# Patient Record
Sex: Female | Born: 1955 | Race: White | Hispanic: No | Marital: Married | Smoking: Never smoker
Health system: Southern US, Academic
[De-identification: ages and names within clinical notes are randomized; demographics above are authoritative.]

## PROBLEM LIST (undated history)

## (undated) DIAGNOSIS — I428 Other cardiomyopathies: Secondary | ICD-10-CM

## (undated) DIAGNOSIS — I10 Essential (primary) hypertension: Secondary | ICD-10-CM

## (undated) DIAGNOSIS — E785 Hyperlipidemia, unspecified: Secondary | ICD-10-CM

## (undated) DIAGNOSIS — M1990 Unspecified osteoarthritis, unspecified site: Secondary | ICD-10-CM

## (undated) DIAGNOSIS — I82409 Acute embolism and thrombosis of unspecified deep veins of unspecified lower extremity: Secondary | ICD-10-CM

## (undated) DIAGNOSIS — I447 Left bundle-branch block, unspecified: Secondary | ICD-10-CM

## (undated) DIAGNOSIS — I517 Cardiomegaly: Secondary | ICD-10-CM

## (undated) HISTORY — DX: Acute embolism and thrombosis of unspecified deep veins of unspecified lower extremity (CMS HCC): I82.409

## (undated) HISTORY — PX: HX COLECTOMY: SHX59

## (undated) HISTORY — PX: HX TUBAL LIGATION: SHX77

## (undated) HISTORY — PX: CARDIAC CATHETERIZATION: SHX172

## (undated) HISTORY — PX: HIATAL HERNIA REPAIR: SHX195

## (undated) HISTORY — DX: Other cardiomyopathies (CMS HCC): I42.8

## (undated) HISTORY — PX: HX DILATION AND CURETTAGE: SHX78

## (undated) HISTORY — DX: Hyperlipidemia, unspecified: E78.5

## (undated) HISTORY — PX: HX TONSIL AND ADENOIDECTOMY: SHX28

## (undated) HISTORY — DX: Left bundle-branch block, unspecified: I44.7

## (undated) HISTORY — PX: HX BREAST LUMPECTOMY: SHX2

---

## 1991-11-09 ENCOUNTER — Other Ambulatory Visit (HOSPITAL_COMMUNITY): Payer: Self-pay

## 2020-12-31 IMAGING — MR MRI ANKLE LT WO CONTAST
9 series · 37 of 40 positions shown · IV contrast (gadolinium)
Comparison: None available.

﻿EXAM:  [id number]   MRI ANKLE LT WO CONTAST
INDICATION: Pain, recent twisting injury.
TECHNIQUE: Multiplanar multisequential MRI of the left ankle joint was performed without gadolinium contrast.

[Series 5: shim axial · axial · left · 10.0mm · 3.12mm/px · z∈[-40,+90]mm · 3 of 14 slices shown (1 of 2)]
[im 1/14]
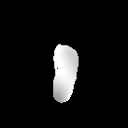
[im 7/14]
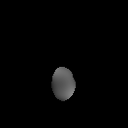
[im 14/14]
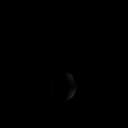

[Series 6: shim axial · axial · left · 10.0mm · 3.12mm/px · z∈[-40,+90]mm · 3 of 14 slices shown (2 of 2)]
[im 1/14]
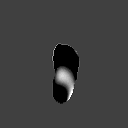
[im 7/14]
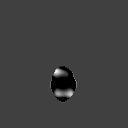
[im 14/14]
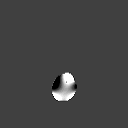

[Series 7: s-map · sagittal · left · 3.8mm · 3.75mm/px · 8 of 62 slices shown]
[im 1/62]
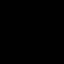
[im 7/62]
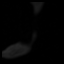
[im 21/62]
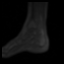
[im 28/62]
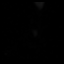
[im 34/62]
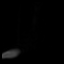
[im 41/62]
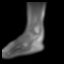
[im 55/62]
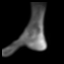
[im 62/62]
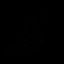

[Series 8: T1 · axial · left · 5.0mm · 0.29mm/px · z∈[-43,+105]mm · 5 of 28 slices shown (1 of 3)]
[im 1/28]
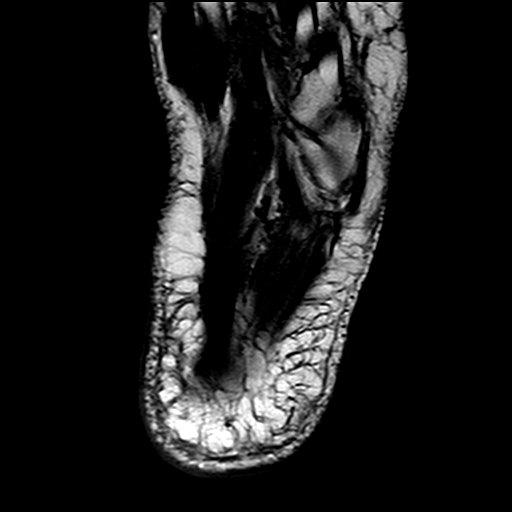
[im 7/28]
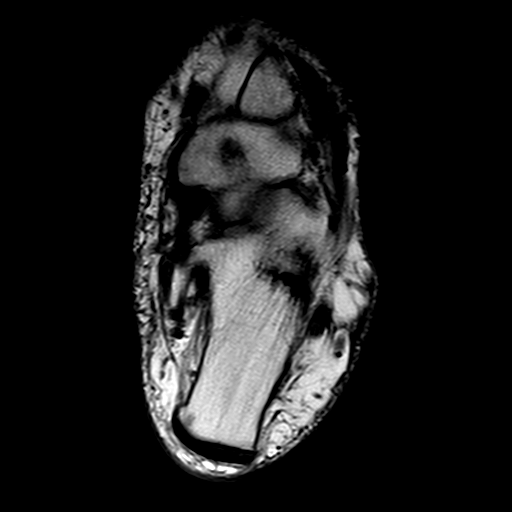
[im 14/28]
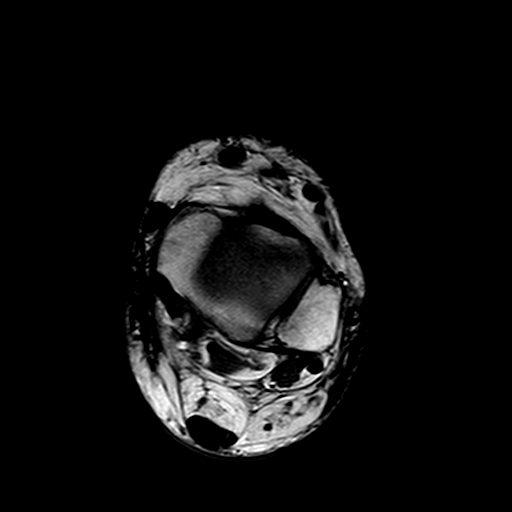
[im 21/28]
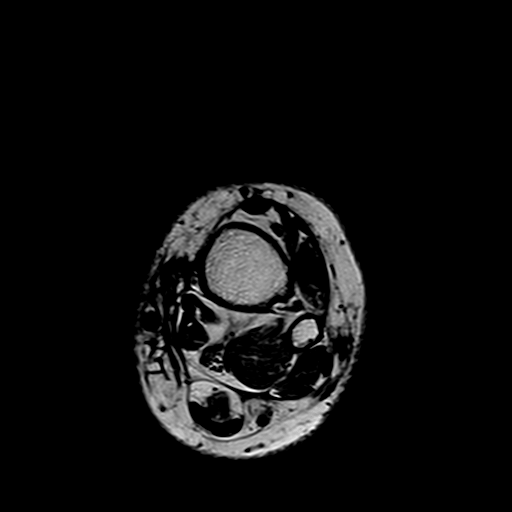
[im 28/28]
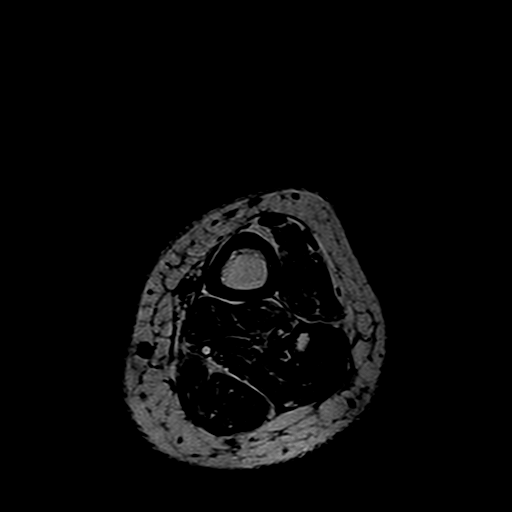

[Series 9: T2 fat-sat · axial · left · 5.0mm · 0.33mm/px · z∈[-43,+105]mm · 5 of 28 slices shown]
[im 1/28]
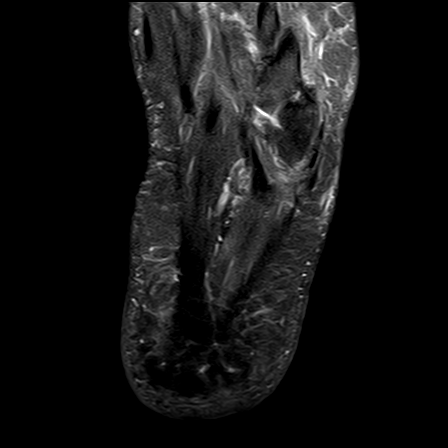
[im 7/28]
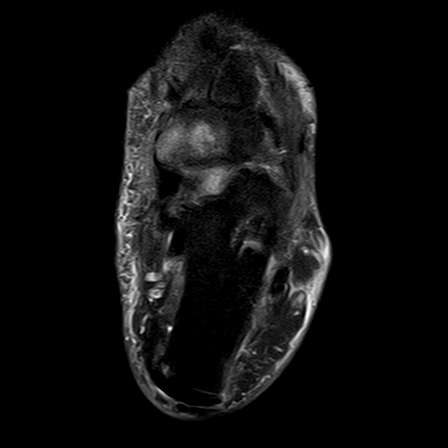
[im 14/28]
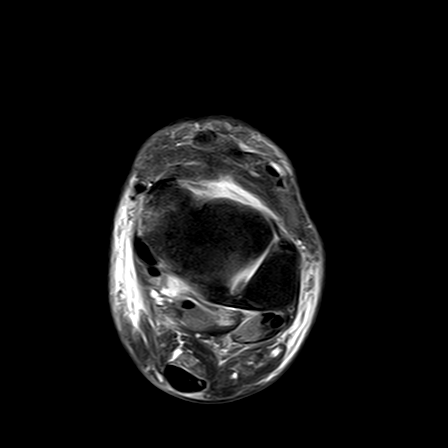
[im 21/28]
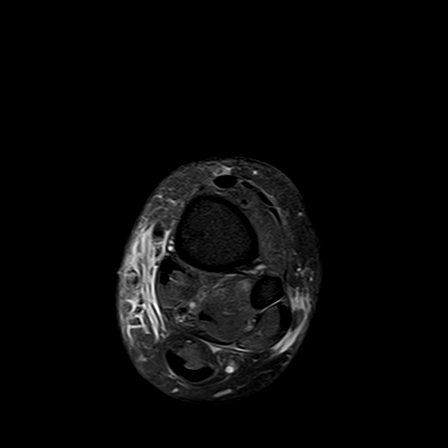
[im 28/28]
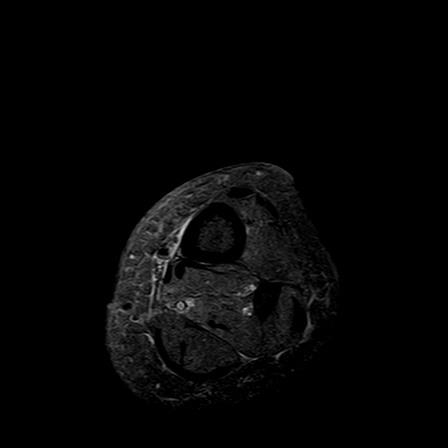

[Series 10: T1 · sagittal · left · 4.0mm · 0.33mm/px · 3 of 20 slices shown (2 of 3)]
[im 1/20]
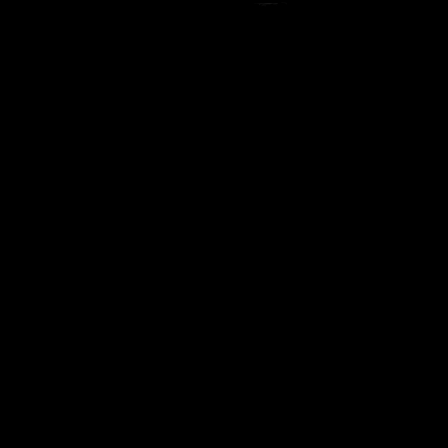
[im 10/20]
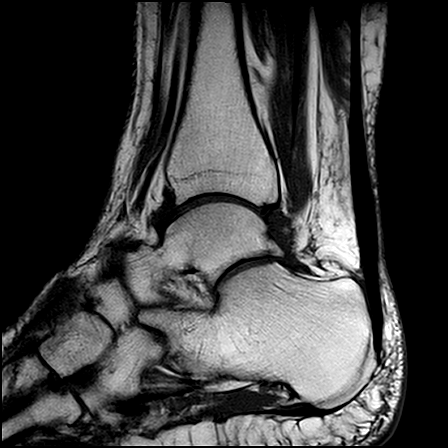
[im 20/20]
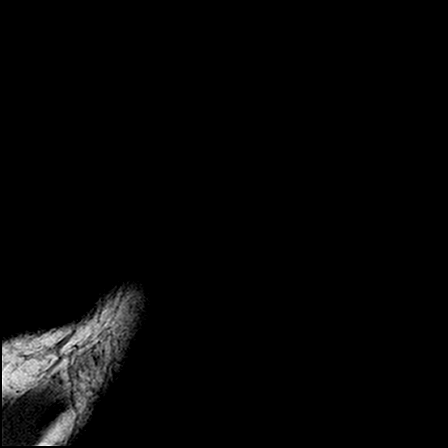

[Series 11: STIR · sagittal · left · 4.0mm · 0.47mm/px · 3 of 20 slices shown (1 of 2)]
[im 1/20]
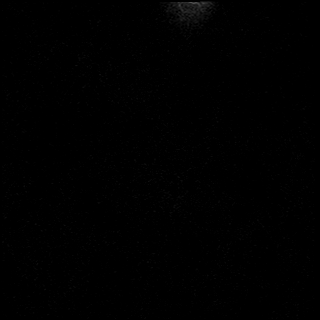
[im 10/20]
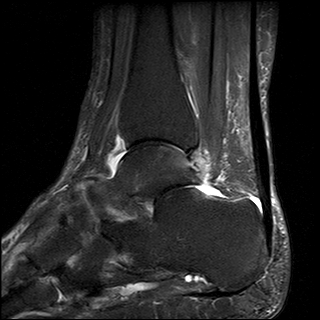
[im 20/20]
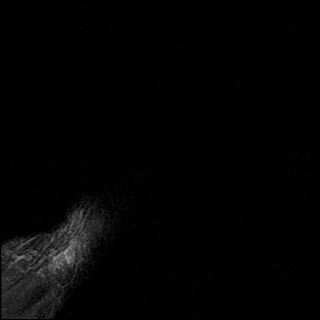

[Series 12: T1 · coronal · left · 4.0mm · 0.29mm/px · 4 of 26 slices shown (3 of 3)]
[im 1/26]
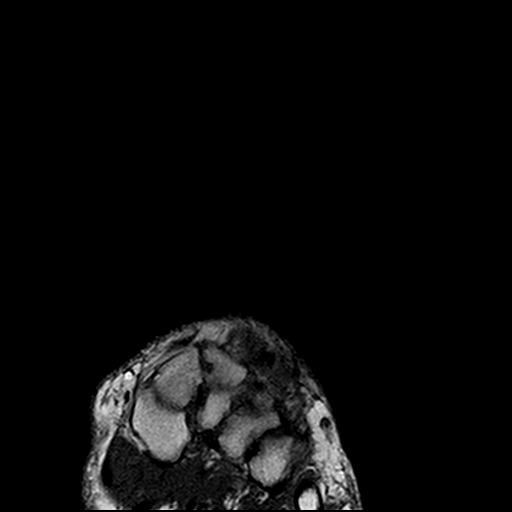
[im 9/26]
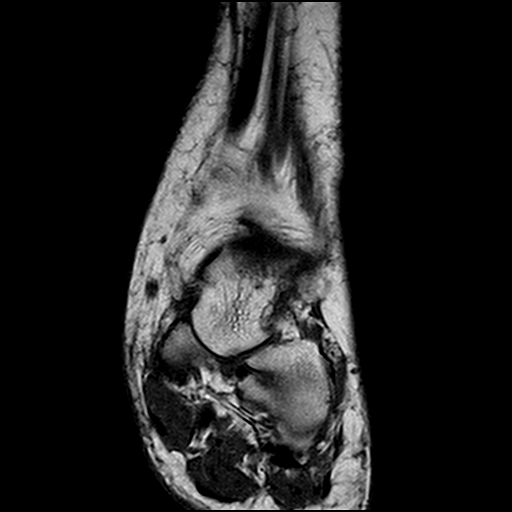
[im 17/26]
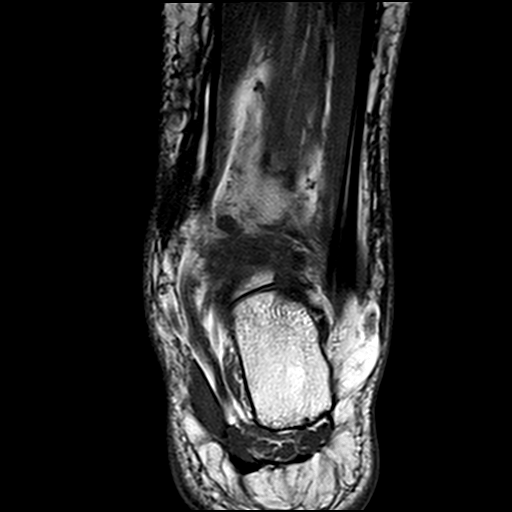
[im 26/26]
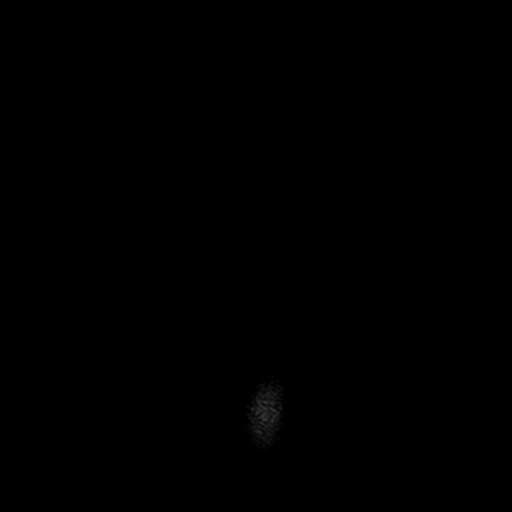

[Series 13: STIR · coronal · left · 4.0mm · 0.39mm/px · 3 of 26 slices shown (2 of 2)]
[im 1/26]
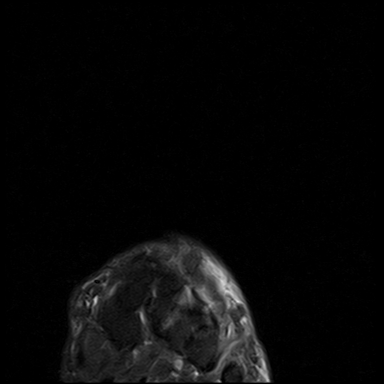
[im 9/26]
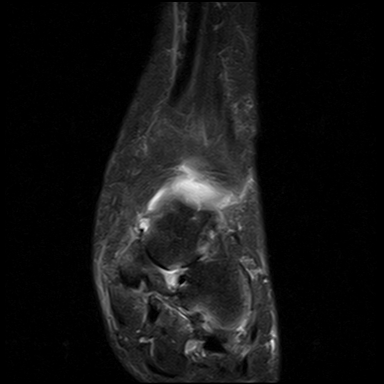
[im 17/26]
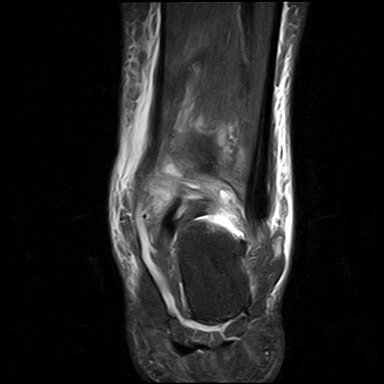

[37 of 40 positions shown; findings below may reference images not displayed]

FINDINGS: There is no acute fracture or subluxation. There is no osteochondral lesion of the talar dome. There is moderate patchy edema within navicular and cuboid bones. There is also mild subcortical edema within the medial malleolus and medial talus. Mild patchy edema is also noted within most tarsal bones as well as the anterior inferior calcaneus. Normal T1 signal intensity is seen within the sinus tarsi. Anterior talofibular ligament is completely torn. There is grade 2 sprain of the deltoid ligament. Posterior talofibular and calcaneofibular ligaments are intact. Small amount of fluid is noted within the tibialis posterior tendon sheath. Peroneal and extensor tendons are normal. Visualized plantar aponeurosis is also unremarkable without fasciitis, fibromatosis or tear. There is mild-to-moderate subcutaneous edema.
IMPRESSION: 1. Moderate patchy edema within multiple bones as detailed above. No definite evidence of an acute fracture. 

2. Completely torn anterior talofibular ligament and grade 2 sprain of the deltoid ligament. 

3. Mild tibialis posterior tenosynovitis.

## 2021-02-05 IMAGING — CR XRAY HAND MINIMUM 3 VIEWS RT
1 series · 4 of 4 positions shown · non-contrast
Comparison: None available.

﻿EXAM:  [id number]      XRAY HAND MINIMUM 3 VIEWS RT
INDICATION: Index finger pain and swelling for 2 weeks.

[Series 1: view not recorded · 0.17mm/px · 4 of 4 slices shown]
[im 1/4]
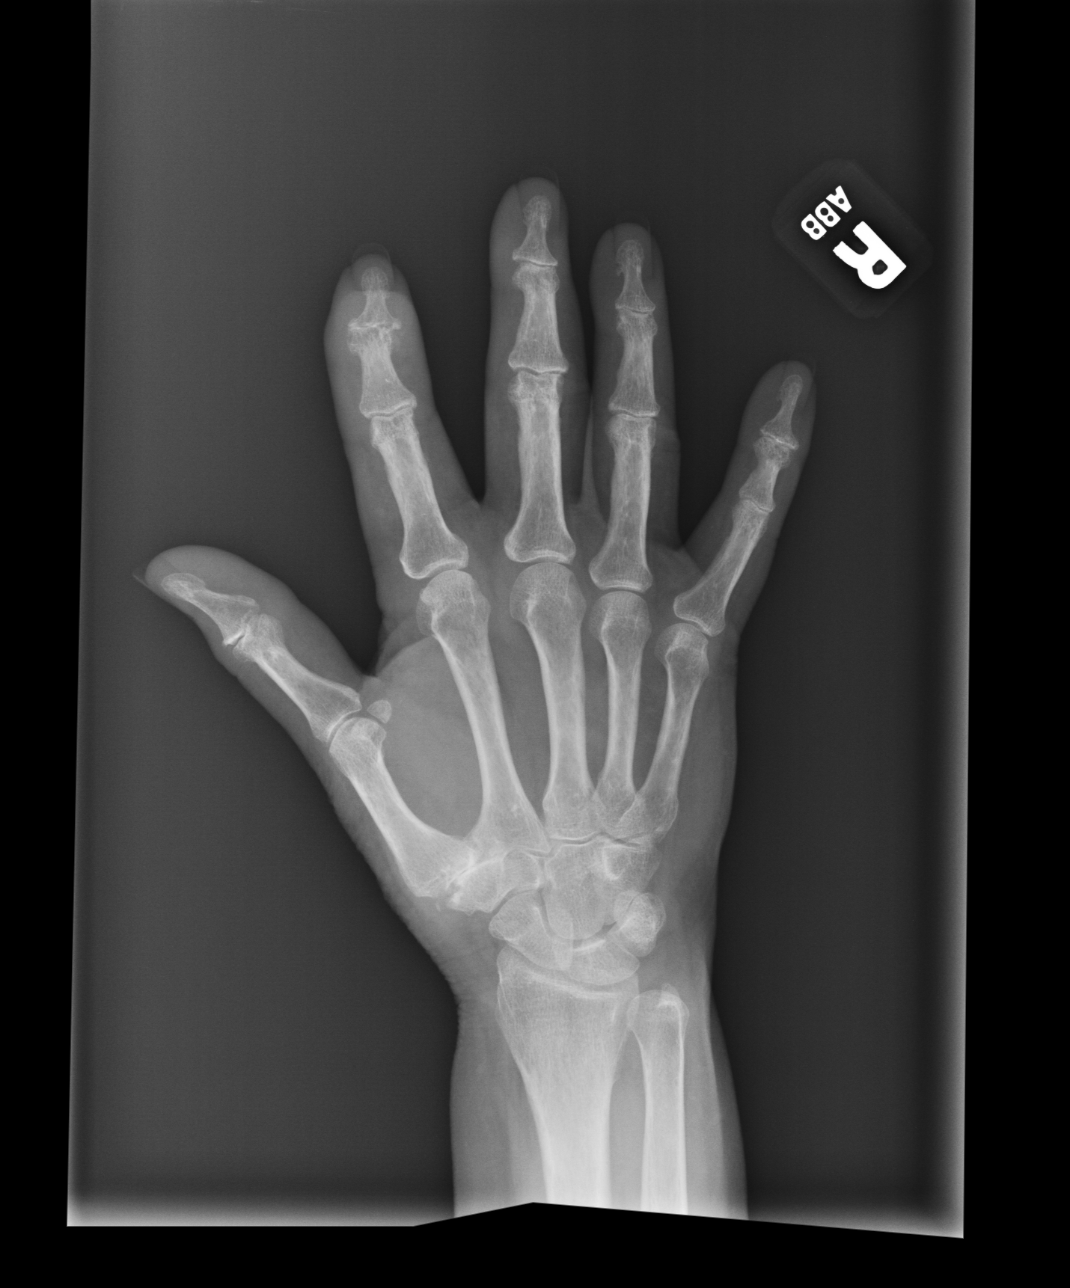
[im 2/4]
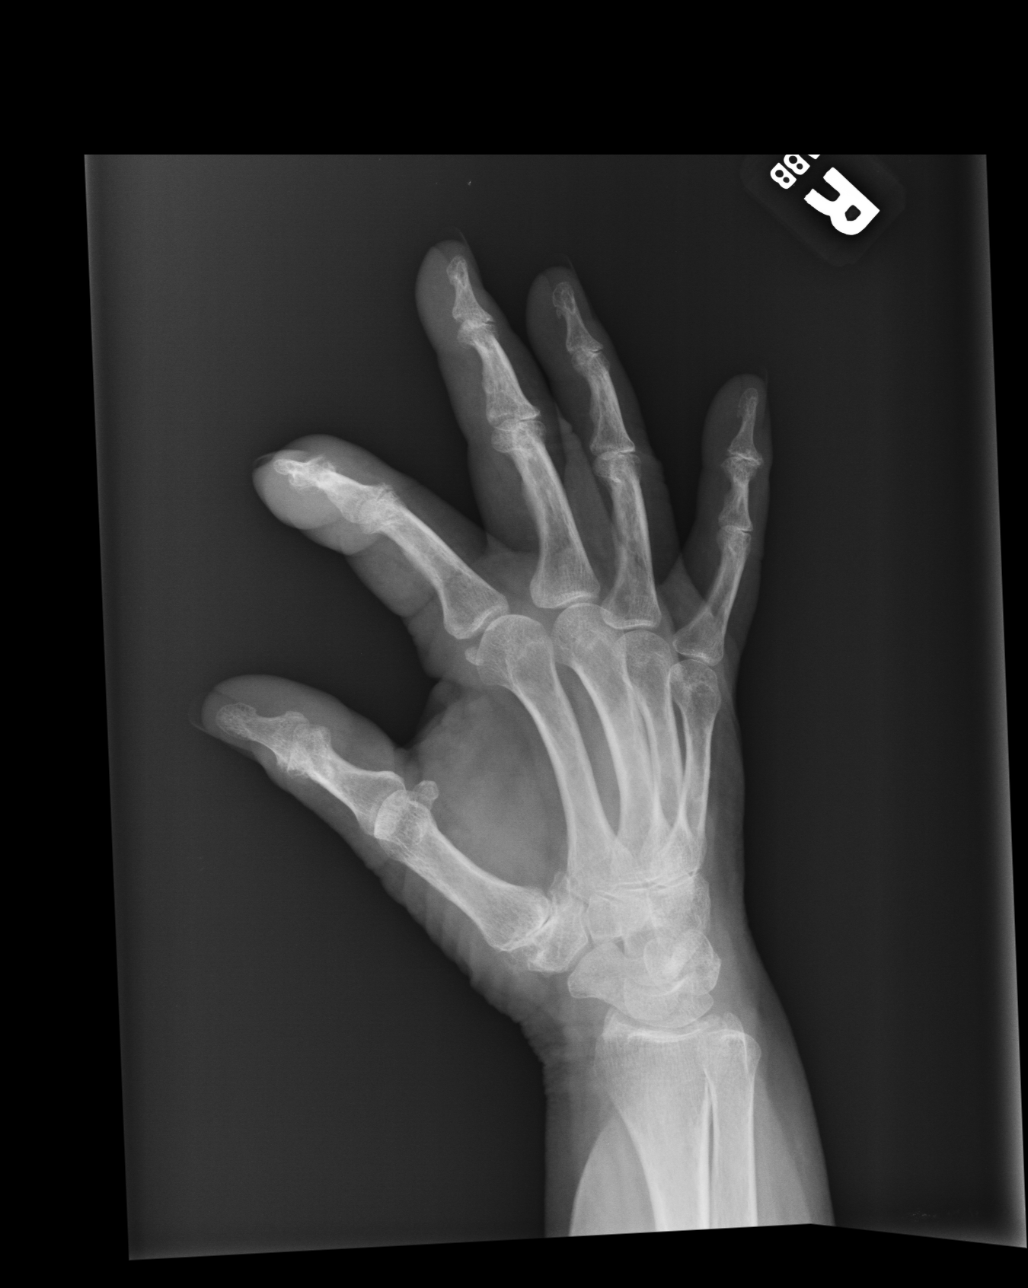
[im 3/4]
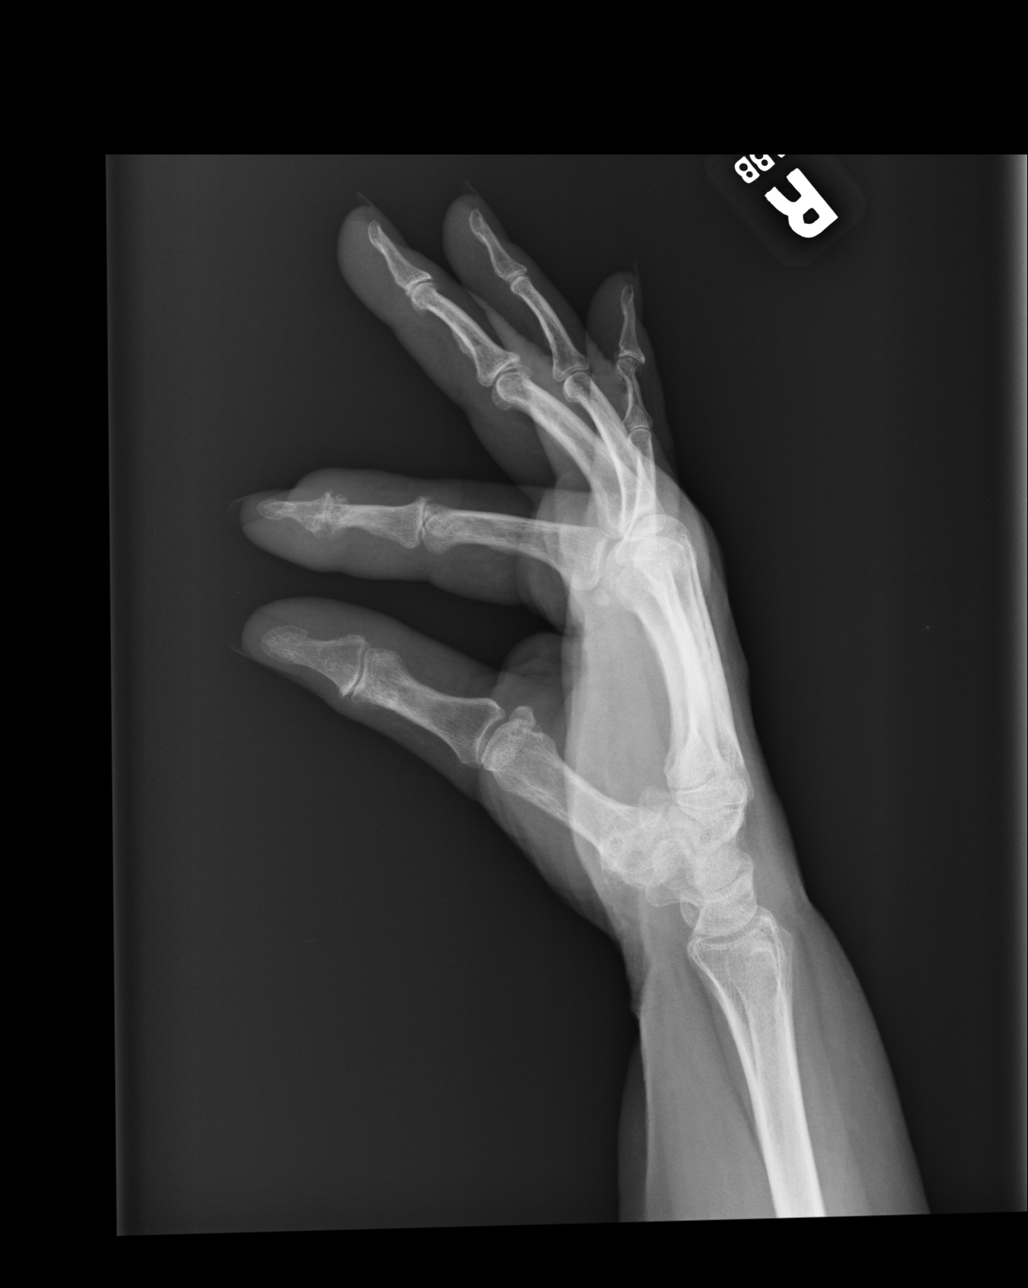
[im 4/4]
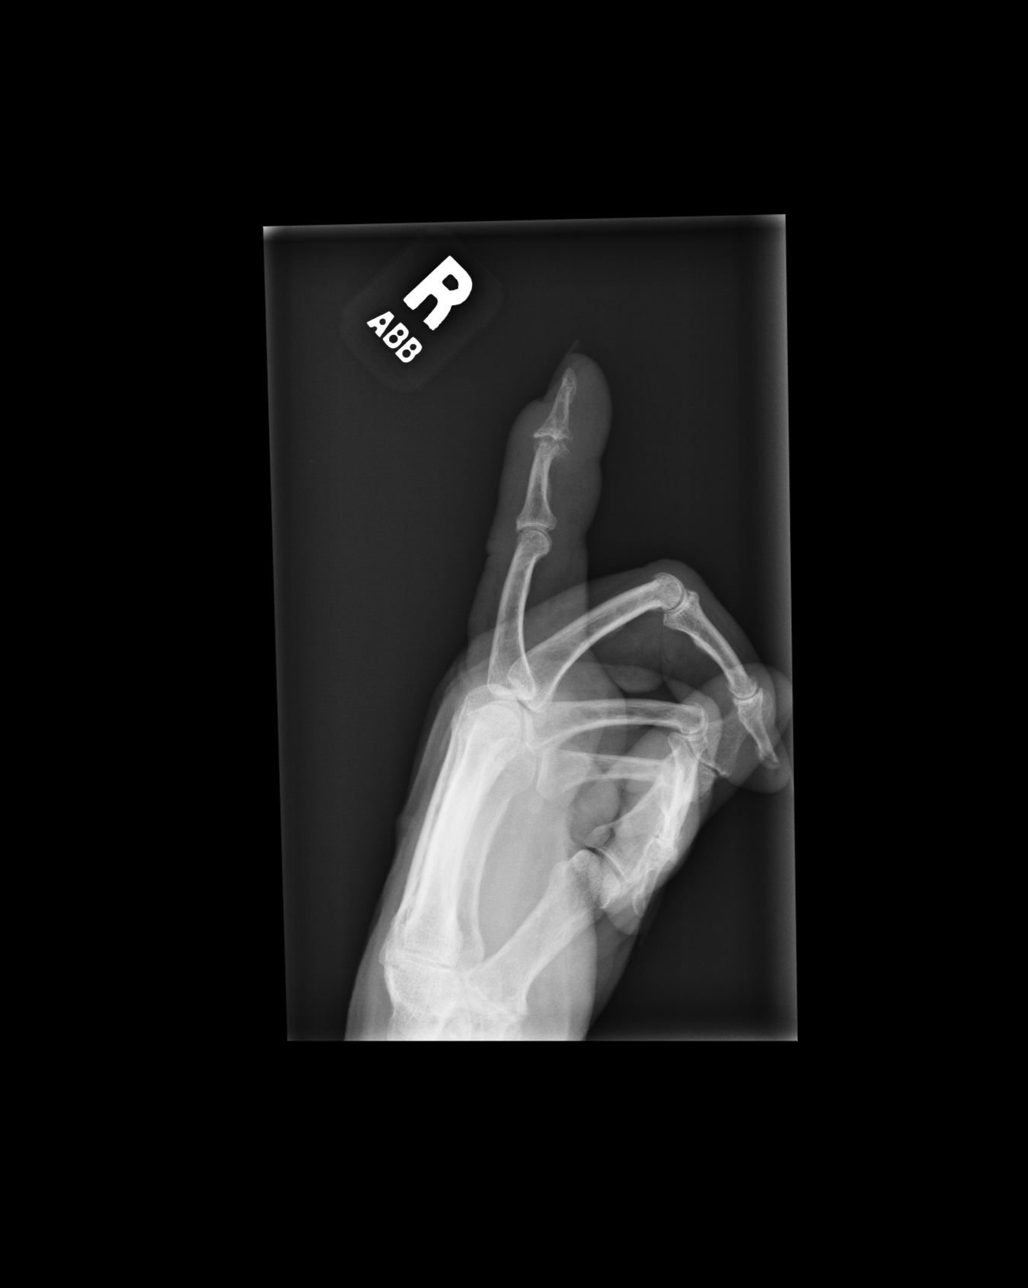

[4 of 4 positions shown; findings below may reference images not displayed]

FINDINGS: Bones are diffusely demineralized. There is no acute fracture or subluxation. There is advanced osteoarthritis of the 1st carpometacarpal and 2nd distal interphalangeal joints. Mild-to-moderate osteoarthritis of multiple other interphalangeal joints is also noted. Surrounding soft tissues are unremarkable.
IMPRESSION: Osteoarthritis, no acute osseous abnormality.

## 2021-03-03 IMAGING — MR MRI HAND RT WO CONTRAST
4 of 7 series · 19 of 40 positions shown · IV contrast (gadolinium)
Comparison: Radiographs dated 02/05/2021.

﻿EXAM:  [id number]   MRI HAND RT WO CONTRAST
INDICATION: 2nd digit pain and swelling for 2 weeks.
TECHNIQUE: Multiplanar multisequential MRI of the right hand was performed without gadolinium contrast.

[Series 6: T1 · axial · right · 4.0mm · 0.31mm/px · z∈[-94,+22]mm · 3 of 38 slices shown]
[im 5/38]
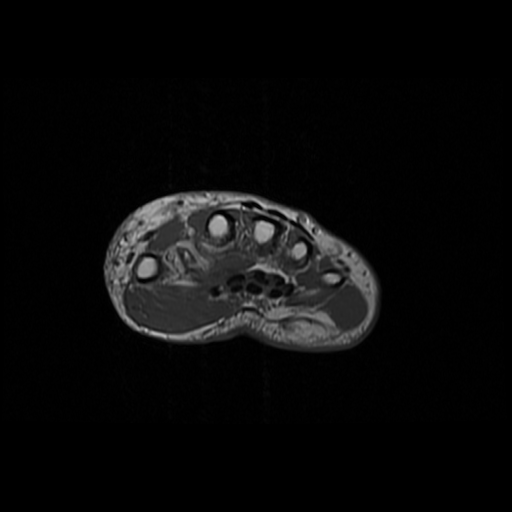
[im 19/38]
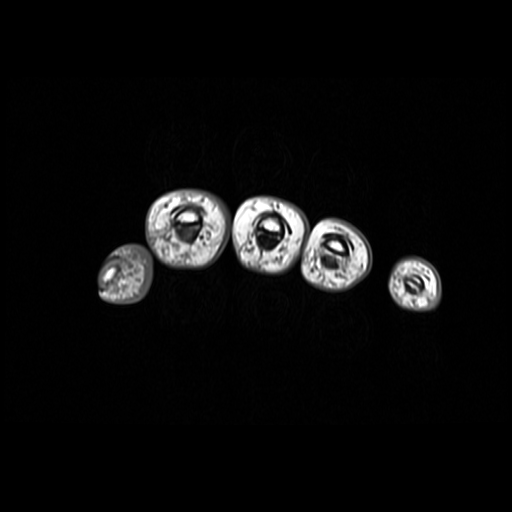
[im 33/38]
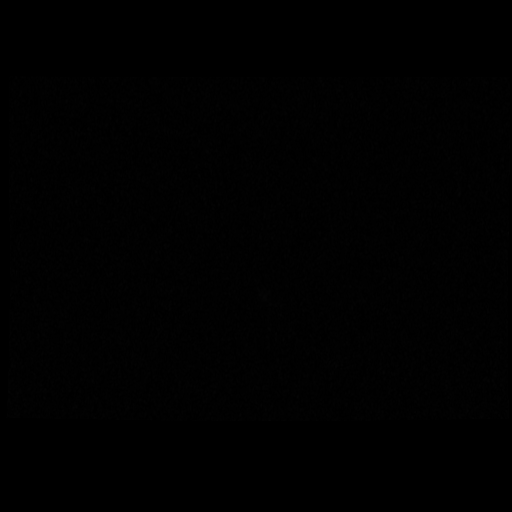

[Series 7: PD fat-sat · axial · right · 4.0mm · 0.31mm/px · z∈[-111,+42]mm · 8 of 38 slices shown (1 of 2)]
[im 1/38]
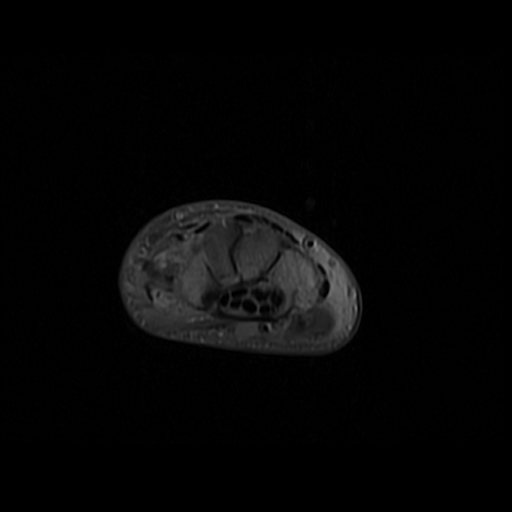
[im 6/38]
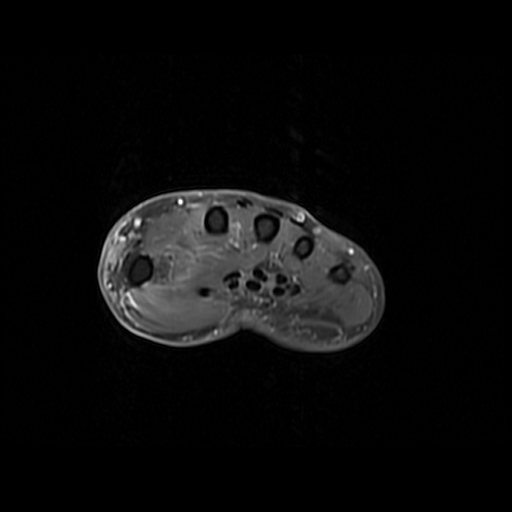
[im 11/38]
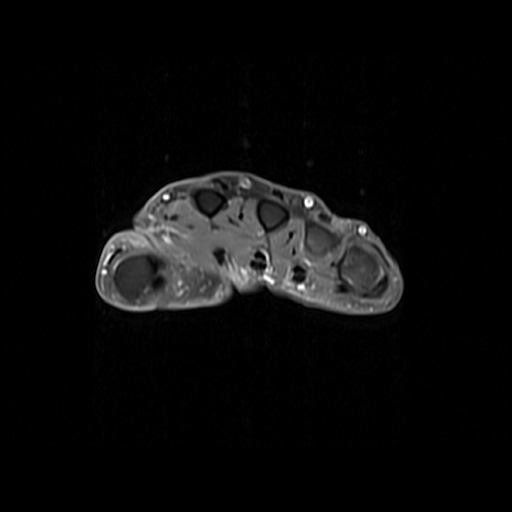
[im 16/38]
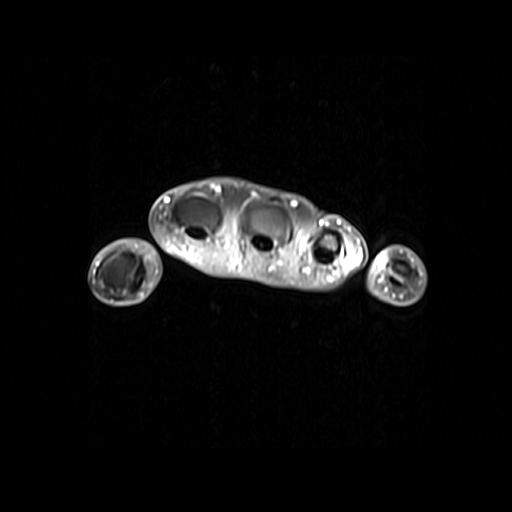
[im 22/38]
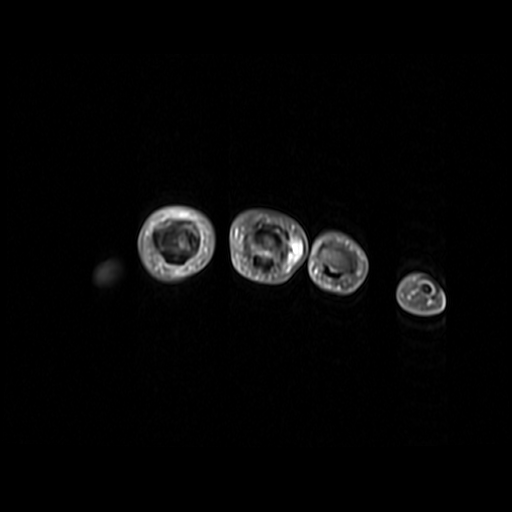
[im 27/38]
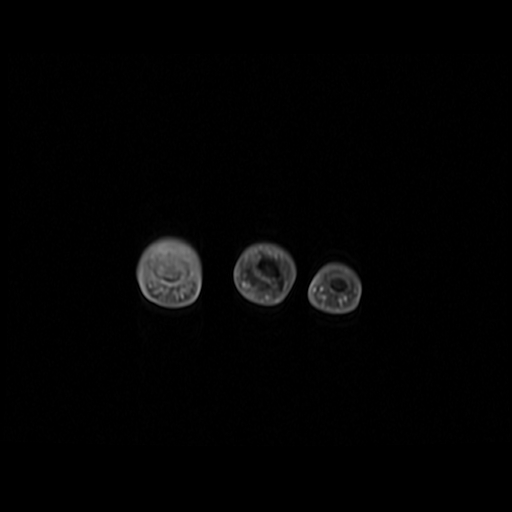
[im 32/38]
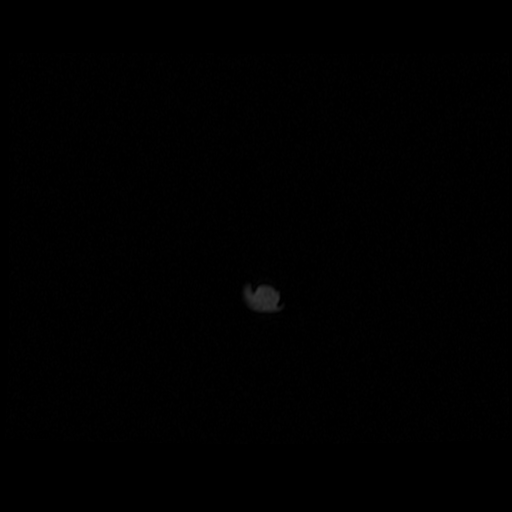
[im 38/38]
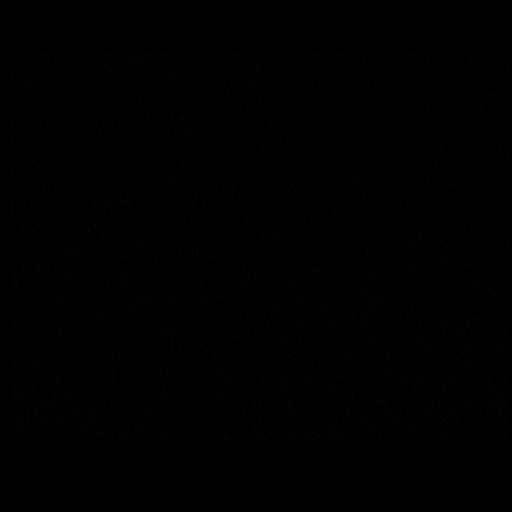

[Series 9: PD fat-sat · coronal · right · 2.5mm · 0.39mm/px · 3 of 15 slices shown (2 of 2)]
[im 1/15]
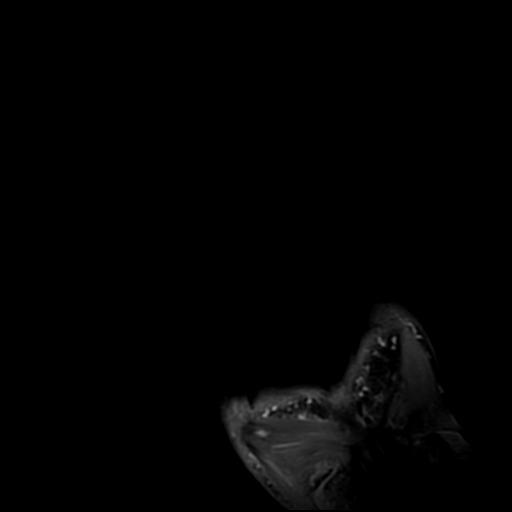
[im 8/15]
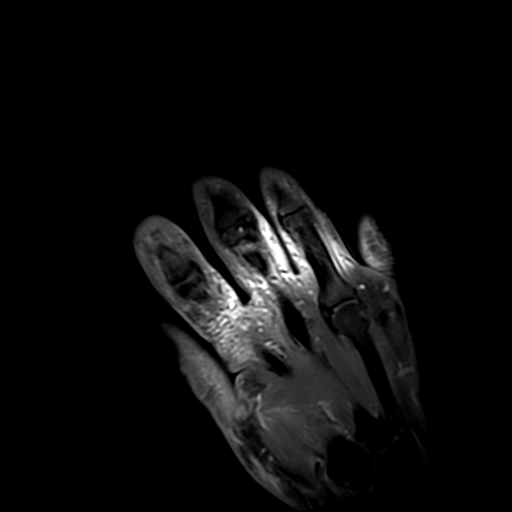
[im 15/15]
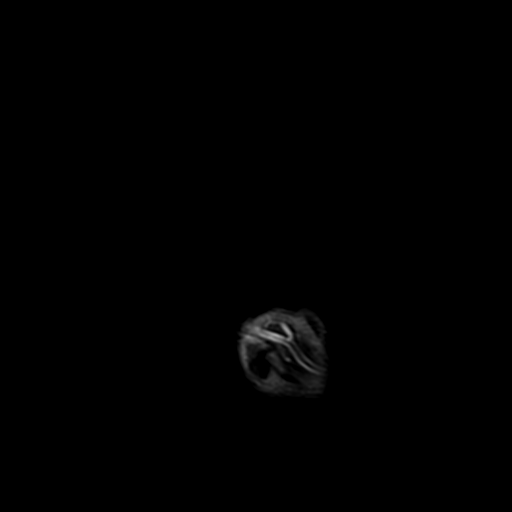

[Series 11: T2 fat-sat · sagittal · right · 3.0mm · 0.39mm/px · 5 of 30 slices shown]
[im 1/30]
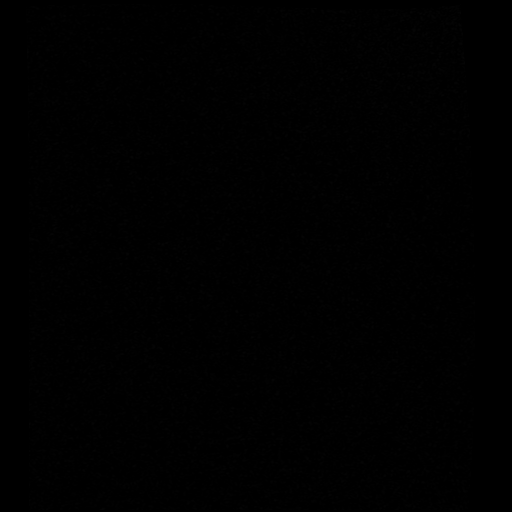
[im 5/30]
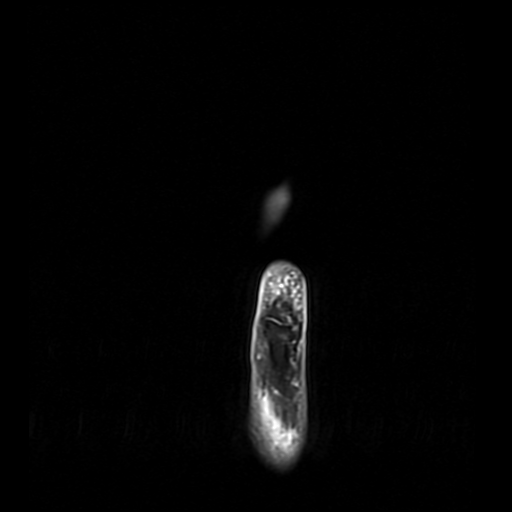
[im 10/30]
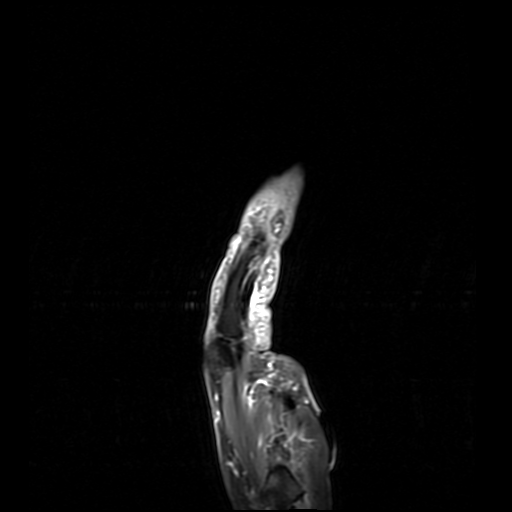
[im 15/30]
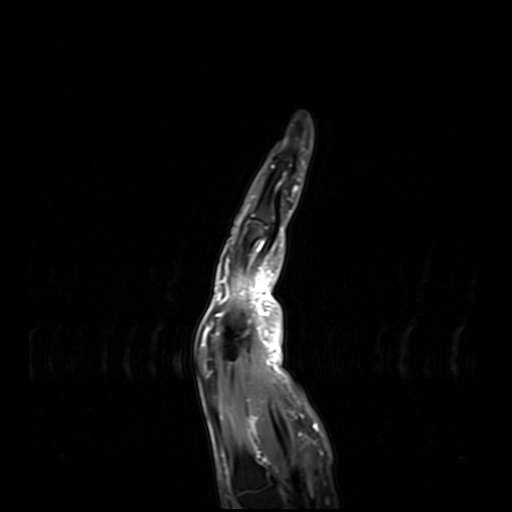
[im 25/30]
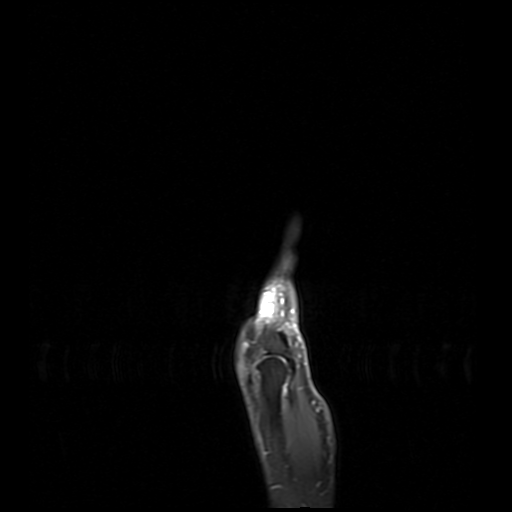

[19 of 40 positions shown; findings below may reference images not displayed]

FINDINGS: Bone marrow signal intensity is normal. There is no acute fracture or subluxation. There is advanced osteoarthritis of the 1st carpometacarpal and 2nd distal interphalangeal joints. Moderate subarticular edema is also seen within the middle and distal phalanges of the index finger along the distal interphalangeal joint. There is also mild-to-moderate overlying soft tissue swelling. Flexor and extensor tendons are normal without evidence of tenosynovitis, tendon degeneration or tear. The intrinsic muscles are normal without definite mass or evidence of denervation atrophy.
IMPRESSION: Advanced osteoarthritis of the 1st carpometacarpal and 2nd distal interphalangeal joints as detailed above.

## 2021-07-19 ENCOUNTER — Other Ambulatory Visit: Payer: Self-pay

## 2021-07-19 ENCOUNTER — Emergency Department (HOSPITAL_BASED_OUTPATIENT_CLINIC_OR_DEPARTMENT_OTHER): Payer: Medicare Other

## 2021-07-19 ENCOUNTER — Emergency Department
Admission: EM | Admit: 2021-07-19 | Discharge: 2021-07-19 | Disposition: A | Discharge status: Home / Self Care | Payer: Medicare Other | Attending: Emergency Medicine | Admitting: Emergency Medicine

## 2021-07-19 ENCOUNTER — Encounter (HOSPITAL_BASED_OUTPATIENT_CLINIC_OR_DEPARTMENT_OTHER): Payer: Self-pay

## 2021-07-19 DIAGNOSIS — S76812A Strain of other specified muscles, fascia and tendons at thigh level, left thigh, initial encounter: Secondary | ICD-10-CM | POA: Insufficient documentation

## 2021-07-19 DIAGNOSIS — W1809XA Striking against other object with subsequent fall, initial encounter: Secondary | ICD-10-CM | POA: Insufficient documentation

## 2021-07-19 DIAGNOSIS — S20212A Contusion of left front wall of thorax, initial encounter: Secondary | ICD-10-CM | POA: Insufficient documentation

## 2021-07-19 DIAGNOSIS — S46911A Strain of unspecified muscle, fascia and tendon at shoulder and upper arm level, right arm, initial encounter: Secondary | ICD-10-CM | POA: Insufficient documentation

## 2021-07-19 DIAGNOSIS — W19XXXA Unspecified fall, initial encounter: Secondary | ICD-10-CM

## 2021-07-19 DIAGNOSIS — S76312A Strain of muscle, fascia and tendon of the posterior muscle group at thigh level, left thigh, initial encounter: Secondary | ICD-10-CM

## 2021-07-19 HISTORY — DX: Cardiomegaly: I51.7

## 2021-07-19 HISTORY — DX: Essential (primary) hypertension: I10

## 2021-07-19 HISTORY — DX: Unspecified osteoarthritis, unspecified site: M19.90

## 2021-07-19 MED ORDER — CYCLOBENZAPRINE 10 MG TABLET
10.0000 mg | ORAL_TABLET | Freq: Three times a day (TID) | ORAL | 0 refills | Status: DC | PRN
Start: 2021-07-19 — End: 2022-02-04

## 2021-07-19 MED ORDER — KETOROLAC 60 MG/2 ML INTRAMUSCULAR SOLUTION
60.0000 mg | INTRAMUSCULAR | Status: AC
Start: 2021-07-19 — End: 2021-07-19
  Administered 2021-07-19: 60 mg via INTRAMUSCULAR

## 2021-07-19 MED ORDER — CYCLOBENZAPRINE 10 MG TABLET
ORAL_TABLET | ORAL | Status: AC
Start: 2021-07-19 — End: 2021-07-19
  Filled 2021-07-19: qty 1

## 2021-07-19 MED ORDER — KETOROLAC 60 MG/2 ML INTRAMUSCULAR SOLUTION
INTRAMUSCULAR | Status: AC
Start: 2021-07-19 — End: 2021-07-19
  Filled 2021-07-19: qty 2

## 2021-07-19 MED ORDER — CYCLOBENZAPRINE 10 MG TABLET
10.0000 mg | ORAL_TABLET | ORAL | Status: AC
Start: 2021-07-19 — End: 2021-07-19
  Administered 2021-07-19: 10 mg via ORAL

## 2021-07-19 MED ORDER — KETOROLAC 10 MG TABLET
10.0000 mg | ORAL_TABLET | Freq: Four times a day (QID) | ORAL | 0 refills | Status: DC | PRN
Start: 2021-07-19 — End: 2021-08-06

## 2021-07-19 NOTE — ED Triage Notes (Signed)
Left hip pain going down left leg to foot. Rates pain 3 at rest. Rates 10 when standing and walking. No deformity to left leg. Also reports left arm pain.

## 2021-07-19 NOTE — ED Provider Notes (Signed)
[facility], [facility] Emergency Department  ED Primary Provider Note  History of Present Illness   Chief Complaint   Patient presents with   . Leg Pain     Laura Leblanc is a 66 y.o. female who had concerns including Leg Pain.  Arrival: The patient arrived by ambulance complaining of standing on a 5 gal bucket trying to fill the bird feeder when it collapsed on her.  Patient did a split with her left leg forward straight and then landed on her left chest wall.  Patient is complaining of pain in her hamstring of her left leg and pain in the left ribs 5 through 8 lateral aspect of the chest.  She is also complaining of her right shoulder being unable to move her arm without severe pain.  Patient denies any neck or lower back pain.  Patient denies any loss of consciousness.  No numbness or tingling in the extremity.  No one-sided weakness.    HPI  Review of Systems   Review of Systems   Constitutional: Positive for activity change. Negative for chills and fever.   HENT: Negative for ear pain and sore throat.    Eyes: Negative for pain and visual disturbance.   Respiratory: Negative for cough and shortness of breath.    Cardiovascular: Negative for chest pain and palpitations.   Gastrointestinal: Negative for abdominal pain and vomiting.   Genitourinary: Negative for dysuria and hematuria.   Musculoskeletal: Positive for arthralgias, gait problem and myalgias. Negative for back pain.   Skin: Negative for color change and rash.   Neurological: Negative for seizures and syncope.   All other systems reviewed and are negative.     Historical Data   History Reviewed This Encounter:     Physical Exam   ED Triage Vitals [07/19/21 1712]   BP (Non-Invasive) 132/61   Heart Rate 82   Respiratory Rate 16   Temperature 37 C (98.6 F)   SpO2 96 %   Weight 89.8 kg (198 lb)   Height 1.727 m (5\' 8" )     Physical Exam  Vitals and nursing note reviewed.   Constitutional:       General: She is not in  acute distress.     Appearance: Normal appearance. She is well-developed. She is obese.   HENT:      Head: Normocephalic and atraumatic.      Right Ear: Tympanic membrane, ear canal and external ear normal.      Left Ear: Tympanic membrane, ear canal and external ear normal.      Mouth/Throat:      Mouth: Mucous membranes are moist.   Eyes:      Extraocular Movements: Extraocular movements intact.      Conjunctiva/sclera: Conjunctivae normal.      Pupils: Pupils are equal, round, and reactive to light.   Cardiovascular:      Rate and Rhythm: Normal rate and regular rhythm.      Pulses: Normal pulses.      Heart sounds: Normal heart sounds. No murmur heard.  Pulmonary:      Effort: Pulmonary effort is normal. No respiratory distress.      Breath sounds: Normal breath sounds.      Comments: Positive tenderness over removed 5 through 8 ribs on the left side lateral aspect.  No crepitus or deformity.  No swelling.  Chest:      Chest wall: Tenderness present.   Abdominal:  General: Bowel sounds are normal.      Palpations: Abdomen is soft.      Tenderness: There is no abdominal tenderness.   Musculoskeletal:         General: Tenderness present. No swelling.      Cervical back: Normal range of motion and neck supple.      Comments: Tenderness over the left hamstring that starts at the buttocks and goes to the knee.  I do not feel swelling or not.  Positive tenderness over the right anterior superior aspect of the right shoulder.  Patient is able to get her arm over her head.   Skin:     General: Skin is warm and dry.      Capillary Refill: Capillary refill takes less than 2 seconds.   Neurological:      General: No focal deficit present.      Mental Status: She is alert and oriented to person, place, and time.   Psychiatric:         Mood and Affect: Mood normal.         Behavior: Behavior normal.         Thought Content: Thought content normal.       Patient Data   Labs Ordered/Reviewed - No data to display  XR FEMUR  LEFT   Final Result by Edi, Radresults In (04/02 1813)   NO ACUTE FRACTURE OR DISLOCATION.             Radiologist location ID: [id number]         XR SHOULDER RIGHT   Final Result by Edi, Radresults In (04/02 1815)   NO ACUTE FRACTURE OR DISLOCATION.                Radiologist location ID: [id number]         XR RIBS LEFT   Final Result by Edi, Radresults In (04/02 1816)   NO EVIDENCE OF ACUTE RIB FRACTURE OR PNEUMOTHORAX.               Radiologist location ID: [id number]           Medical Decision Making        Medical Decision Making  Patient is a 66 year old white female who fell off a 5 gal bucket landing on her left side.  Patient will have x-rays performed of the left femur and left ribs and also right shoulder.    Amount and/or Complexity of Data Reviewed  Radiology: ordered.               Medications Administered in the ED   ketorolac (TORADOL) 60mg /2 mL IM injection (has no administration in time range)   cyclobenzaprine (FLEXERIL) tablet (has no administration in time range)     Clinical Impression   Strain of right shoulder, initial encounter (Primary)   Strain of left hamstring, initial encounter   Contusion of left chest wall, initial encounter       Disposition: Discharged               Clinical Impression   Strain of right shoulder, initial encounter (Primary)   Strain of left hamstring, initial encounter   Contusion of left chest wall, initial encounter       Current Discharge Medication List      START taking these medications    Details   cyclobenzaprine (FLEXERIL) 10 mg Oral Tablet Take 1 Tablet (10 mg total) by mouth Three times a day as needed for Muscle  spasms  Qty: 12 Tablet, Refills: 0      ketorolac tromethamine (TORADOL) 10 mg Oral Tablet Take 1 Tablet (10 mg total) by mouth Every 6 hours as needed for Pain  Qty: 20 Tablet, Refills: 0

## 2021-08-05 ENCOUNTER — Encounter (INDEPENDENT_AMBULATORY_CARE_PROVIDER_SITE_OTHER): Payer: Self-pay | Admitting: INTERVENTIONAL CARDIOLOGY

## 2021-08-06 ENCOUNTER — Other Ambulatory Visit: Payer: Self-pay

## 2021-08-06 ENCOUNTER — Encounter (INDEPENDENT_AMBULATORY_CARE_PROVIDER_SITE_OTHER): Payer: Self-pay | Admitting: NURSE PRACTITIONER

## 2021-08-06 ENCOUNTER — Ambulatory Visit (INDEPENDENT_AMBULATORY_CARE_PROVIDER_SITE_OTHER): Payer: Medicare Other | Admitting: NURSE PRACTITIONER

## 2021-08-06 VITALS — BP 144/82 | HR 78 | Ht 68.0 in | Wt 203.0 lb

## 2021-08-06 DIAGNOSIS — I10 Essential (primary) hypertension: Secondary | ICD-10-CM

## 2021-08-06 DIAGNOSIS — R9431 Abnormal electrocardiogram [ECG] [EKG]: Secondary | ICD-10-CM

## 2021-08-06 DIAGNOSIS — I447 Left bundle-branch block, unspecified: Secondary | ICD-10-CM

## 2021-08-06 DIAGNOSIS — E785 Hyperlipidemia, unspecified: Secondary | ICD-10-CM

## 2021-08-06 DIAGNOSIS — I428 Other cardiomyopathies: Secondary | ICD-10-CM

## 2021-08-06 MED ORDER — IBUPROFEN 800 MG TABLET
800.0000 mg | ORAL_TABLET | Freq: Three times a day (TID) | ORAL | 3 refills | Status: AC | PRN
Start: 2021-08-06 — End: ?

## 2021-08-06 NOTE — Progress Notes (Signed)
Cardiology Clinic [facility]    Name: Laura Leblanc  Age: 66 y.o.  Date of Service: 08/06/2021    Primary Care Provider: Mariah Milling, DO  Chief Complaint:   Chief Complaint   Patient presents with   . Heart Disease       Subjective:  Laura SAMPSEL is a very pleasant 66 y.o. female with a past medical history significant for non-ischemic cardiomyopathy diagnosed in 1997. The patient had a cardiac catheterization in [place] and about 11 years ago she had another cardiac catheterization on [place]. 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 Ejection fraction has been normalized on studies dated 2017 and 2018 with ejection fraction 50% to 60%. Nuclear stress test performed in July 2017 with Lexiscan stress test was normal with EF of 60 %. Echo in July 2019 showed aortic valve sclerosis and no stenosis. Anterior wall and septum were severely hypokinetic. LV function was 40% to 45%. Repeat echocardiogram in May 2020.  EF 45 to 50%. echo on May 25th, 2021 which showed mildly reduced left ventricular systolic function with an ejection fraction estimated at 45-50%.  There appeared to be hypokinesis of the basal to mid anteroseptal wall and some mild degree of dyssynergy, otherwise but this appears to be unchanged from study dated 09/12/2018, unchanged or mildly improved from study dated 09/12/2018. Mild left atrial enlargement. No significant valvular disease. Primary care plain treadmill stress test and echocardiogram in August 2022, plain treadmill stress test was inconclusive as she has underlying LBBB and only exercised for 5 minutes due to foot pain.  Echocardiogram showed reduced EF of 45% with mild basal to mid lateral hypokinesis.  Unchanged from previous.      08/06/21 The patient is here for routine follow-up for nonischemic cardiomyopathy.  She reports she is doing well.  She denies any significant chest pains or shortness of breath.  She  did have a fall recently, she was standing outside on a bucket trying to change a bird feeder and fell.  She did bruise upper left side pretty bad, but she is doing well.  She is tolerating medications without difficulty.  She states her blood pressures been well controlled at home.  She does have some arthritis issues and is wanting to know if I can center in some ibuprofen.  Otherwise, no complaints today.      Past Medical History:  Past Medical History:   Diagnosis Date   . Arthritis    . DVT (deep venous thrombosis) (CMS HCC)    . Dyslipidemia    . Enlarged heart    . Essential hypertension    . Left bundle branch block    . Non-ischemic cardiomyopathy (CMS HCC)          Social History:  Social History     Tobacco Use   Smoking Status Never   Smokeless Tobacco Never      Social History     Substance and Sexual Activity   Alcohol Use Never      Social History     Substance and Sexual Activity   Drug Use Never      Current Medications:  Current Outpatient Medications   Medication Sig   . aspirin (ECOTRIN) 81 mg Oral Tablet, Delayed Release (E.C.) Take 1 Tablet (81 mg total) by mouth Per instructions 3 times a week (Patient not taking: Reported on 08/06/2021)   . carvediloL (COREG) 6.25  mg Oral Tablet Take 2 Tablets (12.5 mg total) by mouth Twice daily with food   . cyclobenzaprine (FLEXERIL) 10 mg Oral Tablet Take 1 Tablet (10 mg total) by mouth Three times a day as needed for Muscle spasms (Patient not taking: Reported on 08/06/2021)   . docusate sodium (COLACE) 100 mg Oral Capsule Take 1 Capsule (100 mg total) by mouth Once a day   . doxycycline 100 mg Oral Tablet Take 1 Tablet (100 mg total) by mouth Twice daily (Patient not taking: Reported on 08/06/2021)   . famotidine (PEPCID) 20 mg Oral Tablet Take 1 Tablet (20 mg total) by mouth Twice daily (Patient not taking: Reported on 08/06/2021)   . furosemide (LASIX) 20 mg Oral Tablet Take 1 Tablet (20 mg total) by mouth Once a day   . Ibuprofen (MOTRIN) 800 mg Oral  Tablet Take 1 Tablet (800 mg total) by mouth Three times a day as needed for Pain   . lisinopriL (PRINIVIL) 20 mg Oral Tablet Take 1 Tablet (20 mg total) by mouth Once a day     Allergies:  Allergies   Allergen Reactions   . Morphine Anaphylaxis      Review of Systems:  Complete ROS was performed and otherwise negative unless noted in HPI.      Vital Signs:  Vitals:    08/06/21 1450 08/06/21 1454   BP: (!) 145/73 (!) 144/82   Pulse: 82 78   SpO2: 96%    Weight: 92.1 kg (203 lb)    Height: 1.727 m (5\' 8" )    BMI: 30.93       Physical Exam:  General: Pt resting comfortably in no acute distress and appears stated age.    Neck: No JVD, no carotid bruit. Neck supple, symmetrical, trachea midline.   Lungs:  Normal respiratory effort, lungs clear to auscultation bilaterally.    Cardiovascular: Regular rate and rhythm without murmurs rubs or gallops and vascular pulses are 2+ throughout.  Abdomen: Soft, non-tender and bowel sounds normal.    Extremities: Extremities normal, atraumatic, no cyanosis or edema.    Neurologic: Alert and oriented x3.     Previous Studies     Last Echocardiogram:  2022    Last Cardiac Catheterization:     Last Myocardial Perfusion Scan: 2022    Assessment:    Nonischemic cardiomyopathy (CMS HCC)    Essential hypertension    Hyperlipidemia, unspecified hyperlipidemia type    LBBB (left bundle branch block)      Plan:   Patient is stable.  Continue current medications.  Recent testing was unremarkable.  Return in 6 months for routine follow-up.  I did discuss with her about her lipids, LDL is elevated in the 120s, total cholesterol is 211.  She refuses statins.  I recommended exercise and diet.    Orders placed this visit:  Orders Placed This Encounter   . EKG (In-Clinic Today)   . Ibuprofen (MOTRIN) 800 mg Oral Tablet       [name] is to return to clinic for follow up with the understanding that should symptoms change or worsen she is to call the office or go to the closest emergency  department for evaluation.    Alene Mires, APRN,FNP-BC    A portion of this documentation may have been generated using MMODAL voice recognition software and may contain syntax/voice recognition errors.

## 2021-08-10 ENCOUNTER — Other Ambulatory Visit (INDEPENDENT_AMBULATORY_CARE_PROVIDER_SITE_OTHER): Payer: Self-pay | Admitting: NURSE PRACTITIONER

## 2021-08-10 MED ORDER — CARVEDILOL 12.5 MG TABLET
12.5000 mg | ORAL_TABLET | Freq: Two times a day (BID) | ORAL | 3 refills | Status: DC
Start: 2021-08-10 — End: 2022-02-04

## 2021-08-10 MED ORDER — LISINOPRIL 20 MG TABLET
20.0000 mg | ORAL_TABLET | Freq: Every day | ORAL | 3 refills | Status: DC
Start: 2021-08-10 — End: 2022-02-04

## 2021-08-10 MED ORDER — FUROSEMIDE 20 MG TABLET
20.0000 mg | ORAL_TABLET | Freq: Every day | ORAL | 3 refills | Status: AC
Start: 2021-08-10 — End: 2021-11-08

## 2021-08-11 LAB — ECG W INTERP (AMB USE ONLY)(MUSE,IN CLINIC)
Atrial Rate: 77 {beats}/min
Calculated P Axis: 77 degrees
Calculated R Axis: -65 degrees
Calculated T Axis: 93 degrees
PR Interval: 168 ms
QRS Duration: 146 ms
QT Interval: 416 ms
QTC Calculation: 470 ms
Ventricular rate: 77 {beats}/min

## 2021-10-22 IMAGING — MR MRI FOOT RT WO CONTRAST
4 of 6 series · 19 of 40 positions shown · IV contrast (gadolinium)
Comparison: None available.

﻿EXAM:  [id number]   MRI FOOT RT WO CONTRAST
INDICATION: Pain, recent fall.
TECHNIQUE: Multiplanar multisequential MRI of the right foot was performed without gadolinium contrast.

[Series 5: T1 · sagittal · right · 3.5mm · 0.53mm/px · 5 of 22 slices shown (1 of 3)]
[im 1/22]
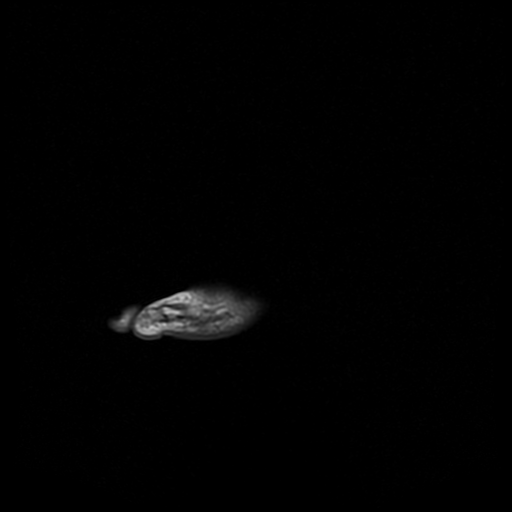
[im 6/22]
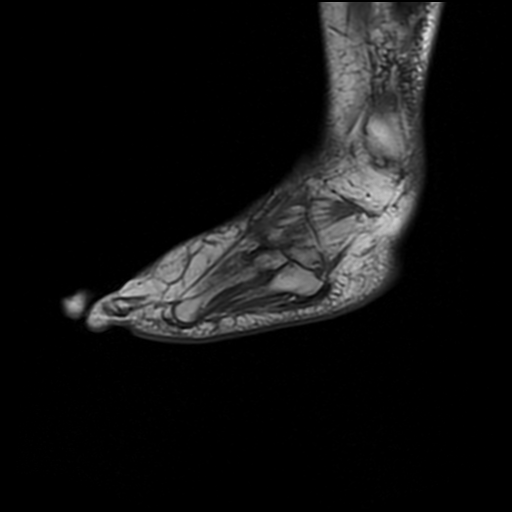
[im 11/22]
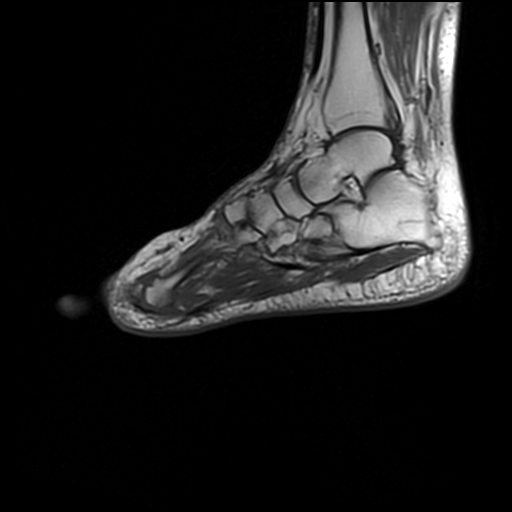
[im 16/22]
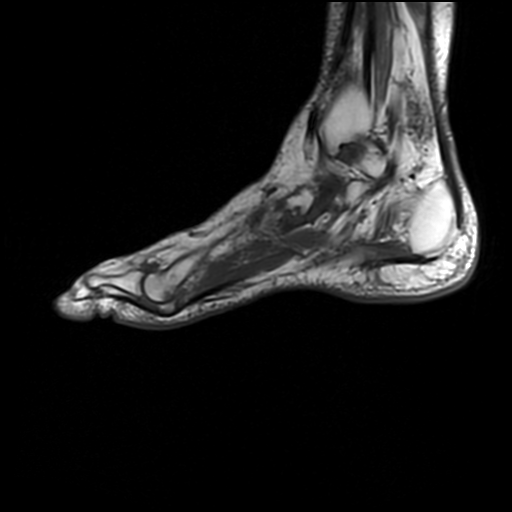
[im 22/22]
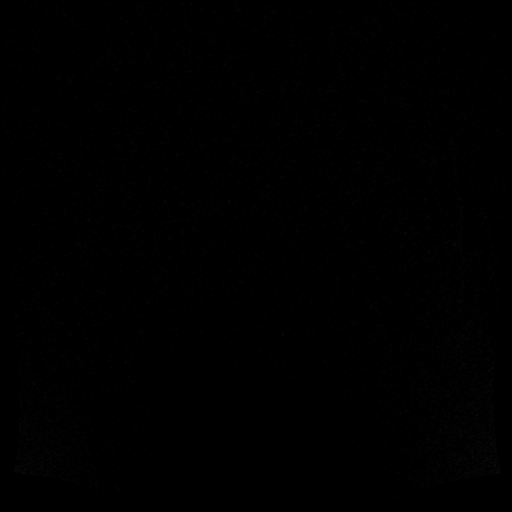

[Series 6: T1 · axial · right · 4.0mm · 0.53mm/px · z∈[-136,-32]mm · 5 of 22 slices shown (2 of 3)]
[im 1/22]
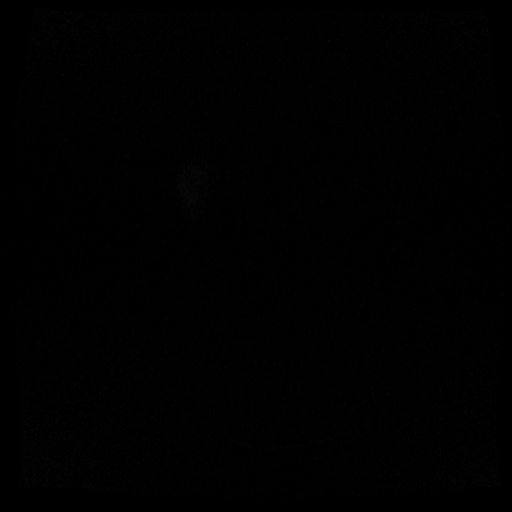
[im 5/22]
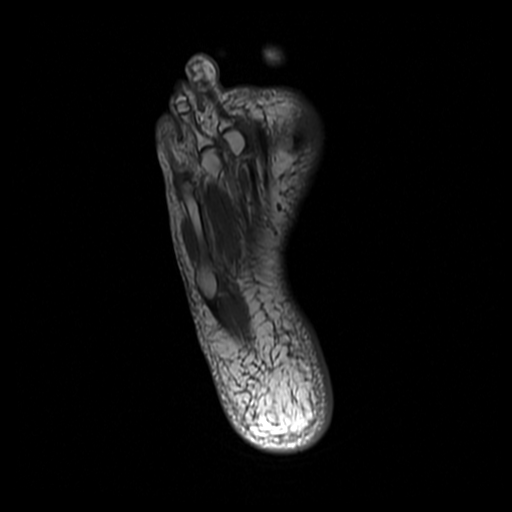
[im 9/22]
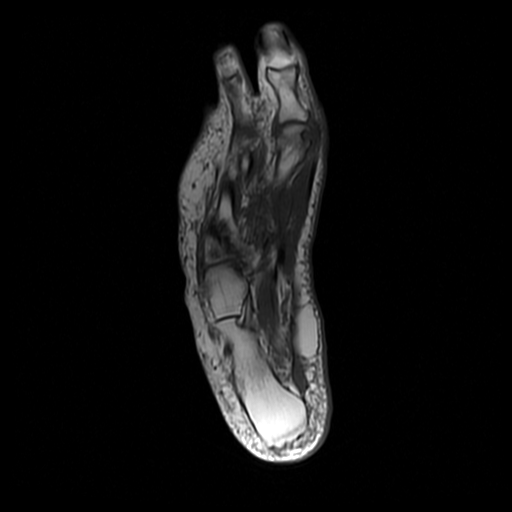
[im 13/22]
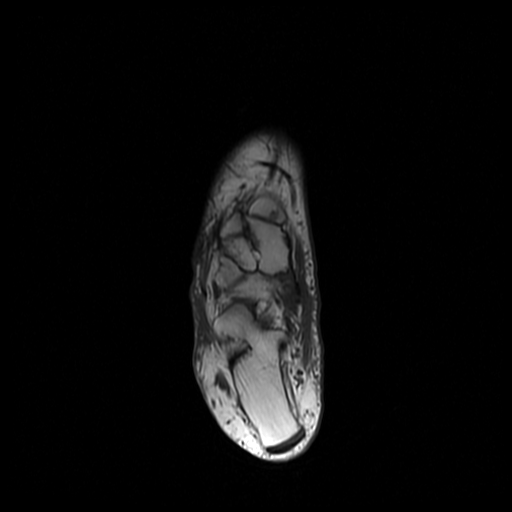
[im 22/22]
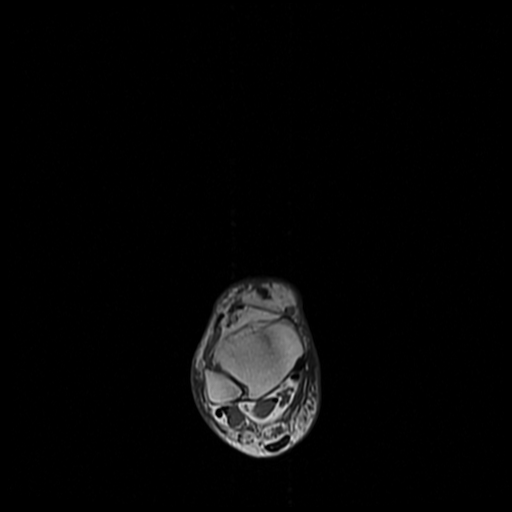

[Series 7: T2 fat-sat · axial · right · 4.0mm · 0.53mm/px · z∈[-136,-32]mm · 6 of 22 slices shown]
[im 1/22]
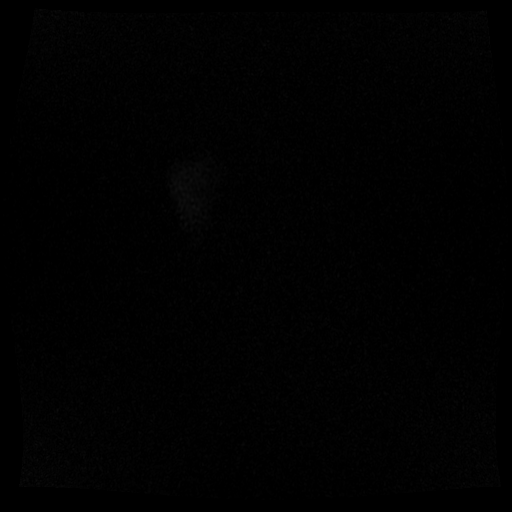
[im 5/22]
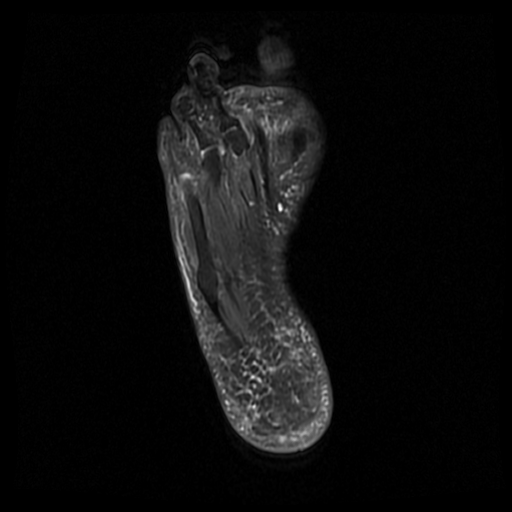
[im 9/22]
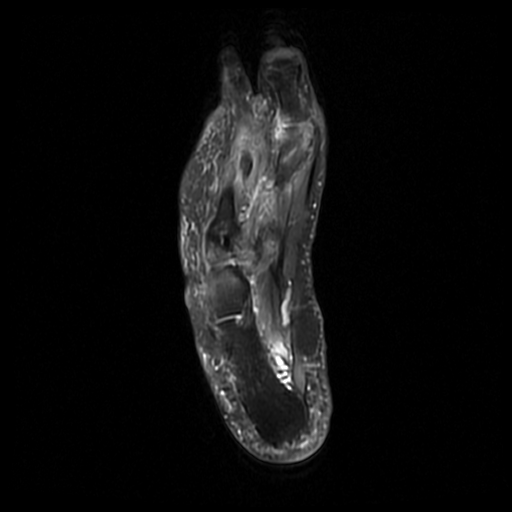
[im 13/22]
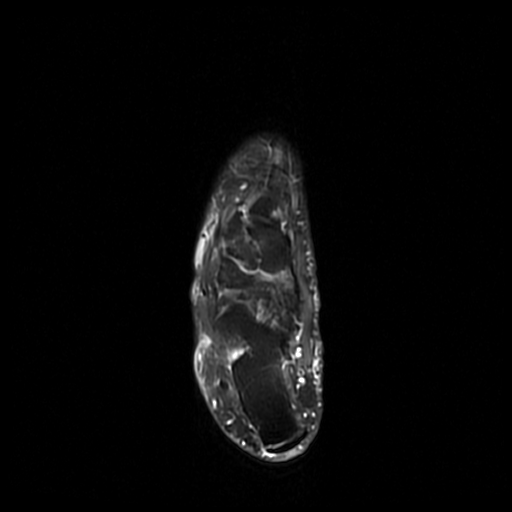
[im 17/22]
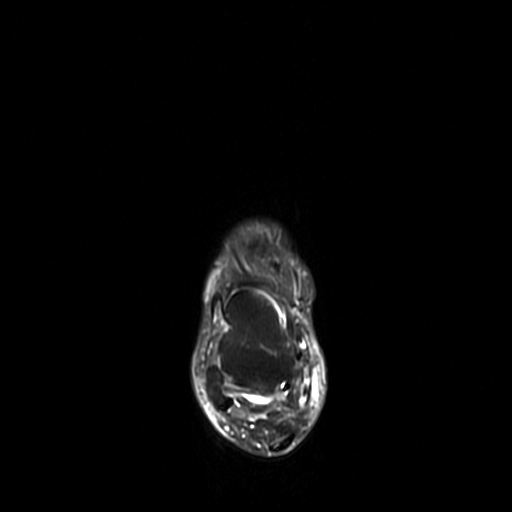
[im 22/22]
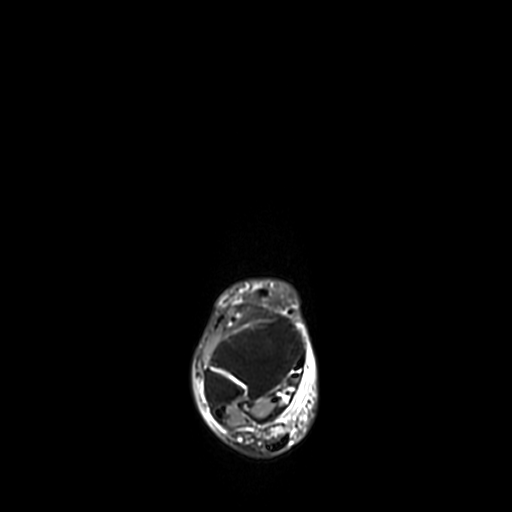

[Series 9: T1 · coronal · right · 6.0mm · 0.29mm/px · 3 of 33 slices shown (3 of 3)]
[im 5/33]
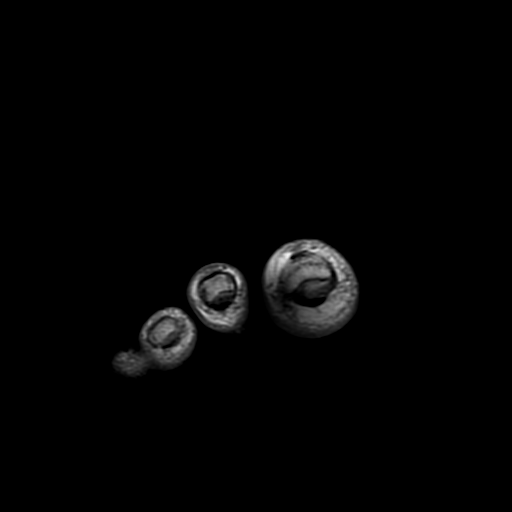
[im 17/33]
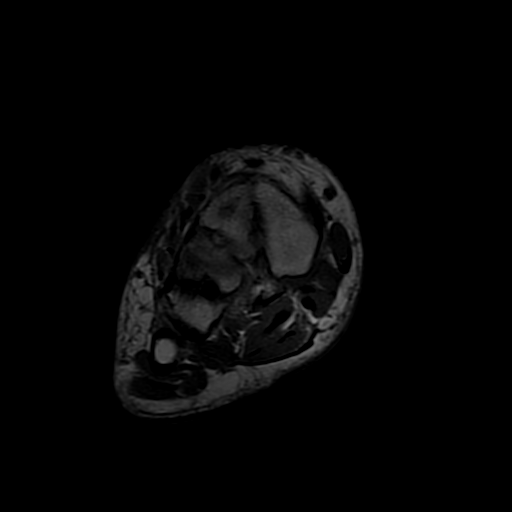
[im 29/33]
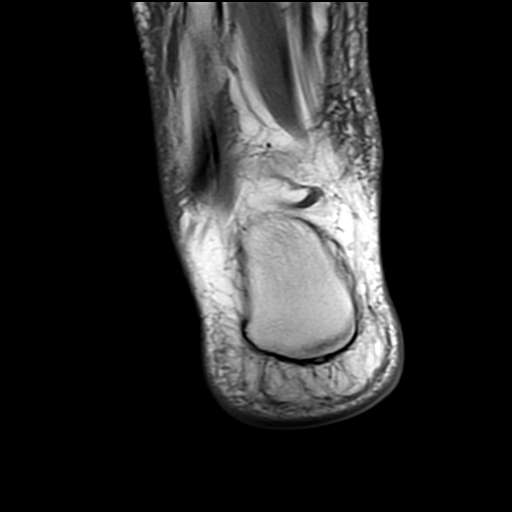

[19 of 40 positions shown; findings below may reference images not displayed]

FINDINGS: There is a nondisplaced fracture of the 2nd toe proximal phalanx. Moderate edema is also noted within the 1st and 2nd metatarsal heads.  There is mild-to-moderate midfoot osteoarthritis.  Normal T1 signal intensity is seen within the sinus tarsi.  There is no osteochondral lesion of the talar dome.  Visualized flexor, extensor and peroneal tendons are normal without evidence of tenosynovitis, tendon degeneration or tear.  Lisfranc ligament is intact.  Intrinsic muscles of the forefoot are normal without definite mass or evidence of denervation atrophy.  The plantar aponeurosis is also normal without fasciitis, fibromatosis or tear.
IMPRESSION: 1. 2nd toe proximal phalanx nondisplaced fracture. 

2. Moderate edema within the 1st and 2nd metatarsal heads.  

3. Mild-to-moderate midfoot osteoarthritis.

## 2021-12-14 IMAGING — DX XRAY HIP W/PELVIS UNIL 2-3 VIEWS
2 series · 2 of 2 positions shown · non-contrast
Comparison: None available.

﻿EXAM:  [id number]      XRAY HIP W/PELVIS UNIL 2-3 VIEWS
INDICATION: Left hip pain.

[AP]
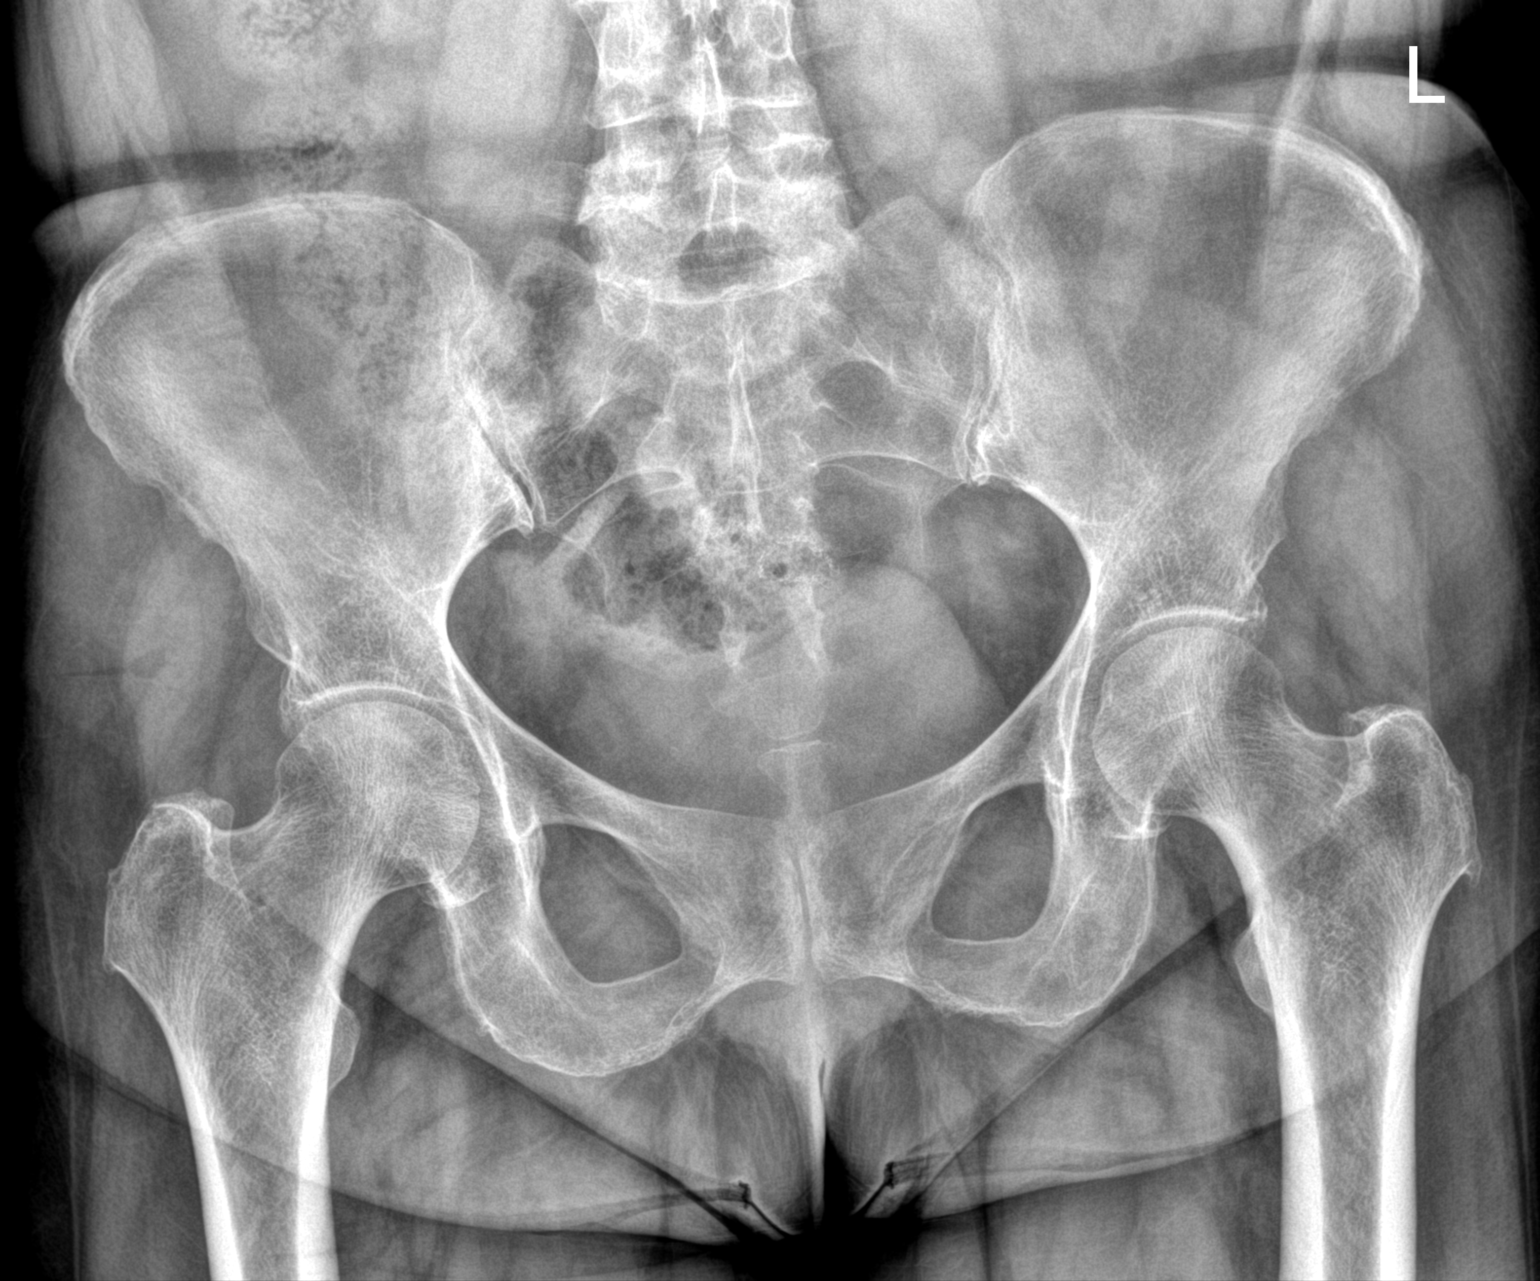

[lateral]
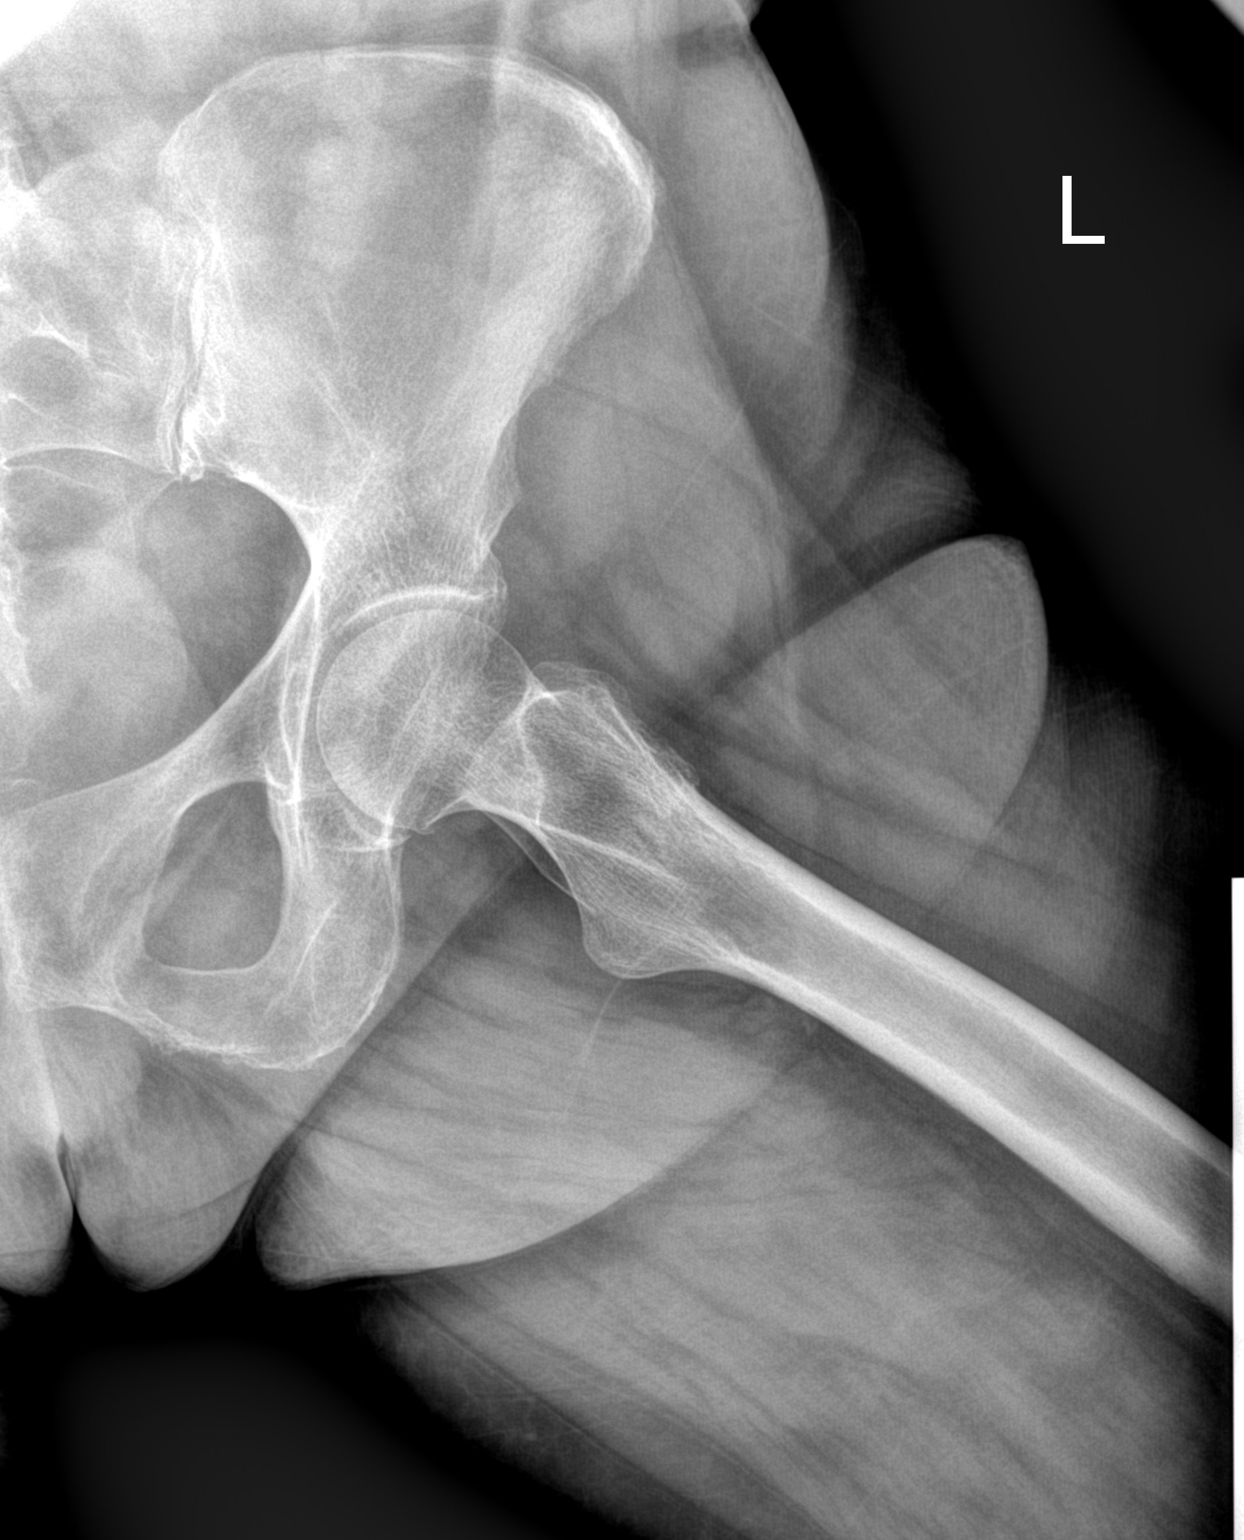

[2 of 2 positions shown; findings below may reference images not displayed]

FINDINGS: Two views demonstrate no fracture, dislocation, or significant hip arthritis.  Mild degenerative changes are noted incidentally in the sacroiliac joints bilaterally.
IMPRESSION: 1. Unremarkable hip.

2. Mild sacroiliac osteoarthritis.

## 2022-01-08 IMAGING — MR MRI HIP LT W/O CONTRAST
5 of 7 series · 24 of 40 positions shown · IV contrast (gadolinium)
Comparison: Radiograph of the pelvis and left hip dated 12/14/2021.

﻿EXAM:  [id number]   MRI HIP LT W/O CONTRAST
INDICATION: Left hip pain in this 65-year-old female.  Sustained trauma due to fall on [DATE]nd.  No prior surgery.
TECHNIQUE: Multiplanar, multisequential MRI of the left hip was performed without gadolinium contrast.

[Series 7: T2 fat-sat · axial · left · 4.5mm · 0.94mm/px · z∈[-70,+75]mm · 6 of 30 slices shown (1 of 3)]
[im 1/30]
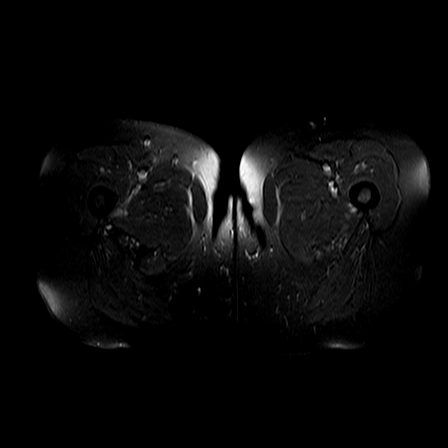
[im 6/30]
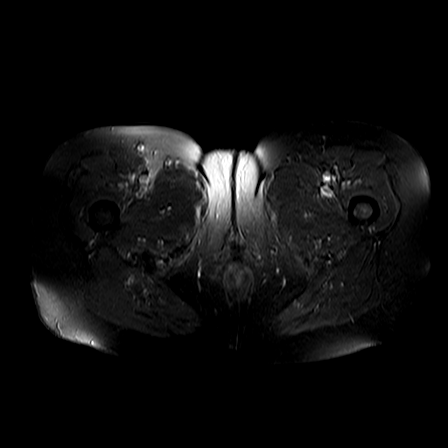
[im 12/30]
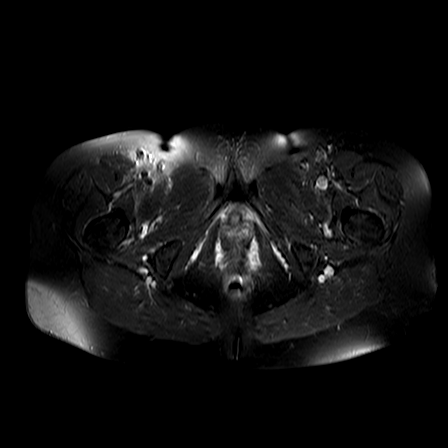
[im 18/30]
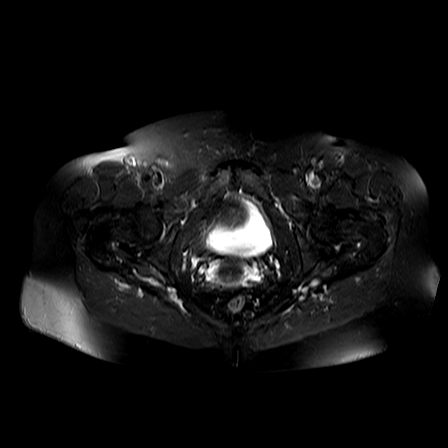
[im 24/30]
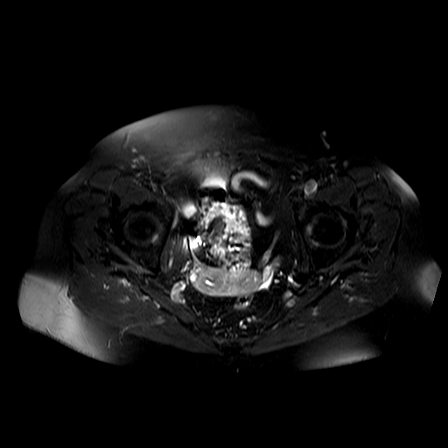
[im 30/30]
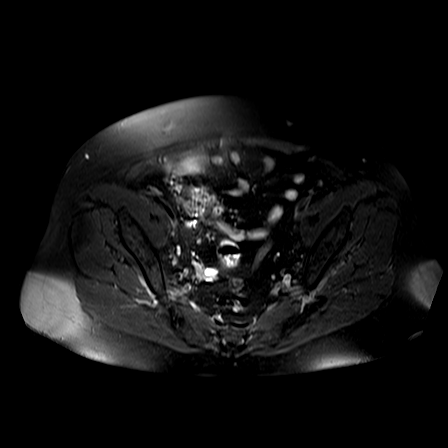

[Series 8: T1 · axial · left · 4.5mm · 0.82mm/px · z∈[-70,+75]mm · 7 of 30 slices shown (1 of 2)]
[im 1/30]
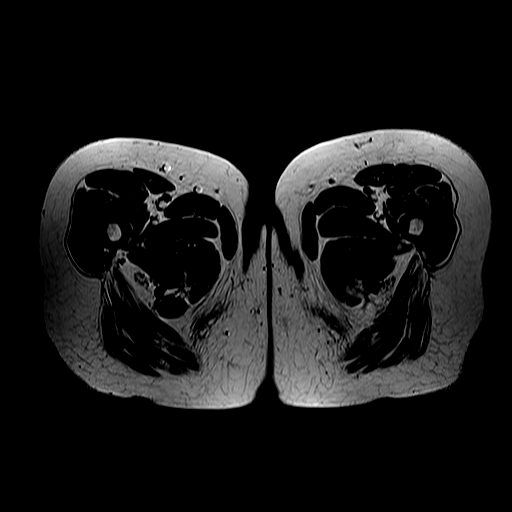
[im 5/30]
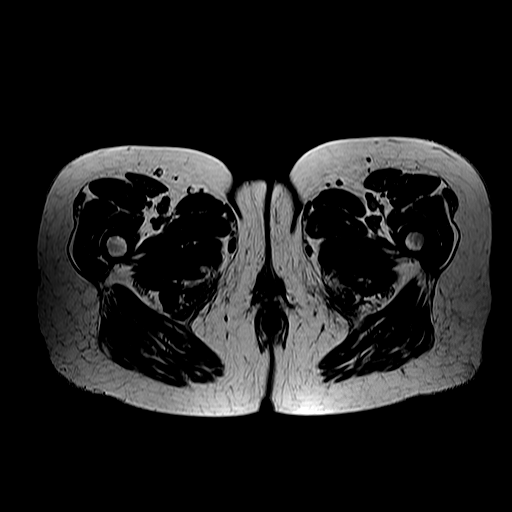
[im 10/30]
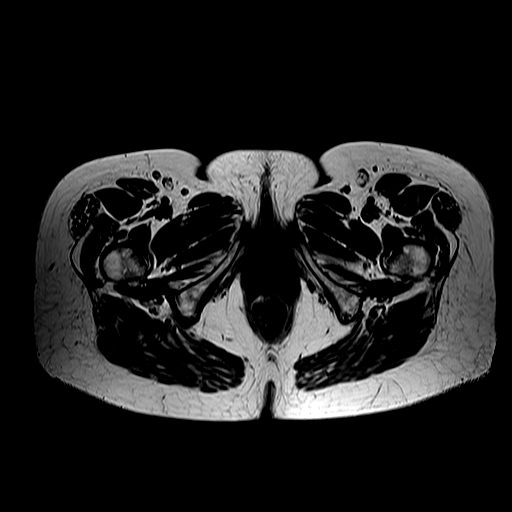
[im 15/30]
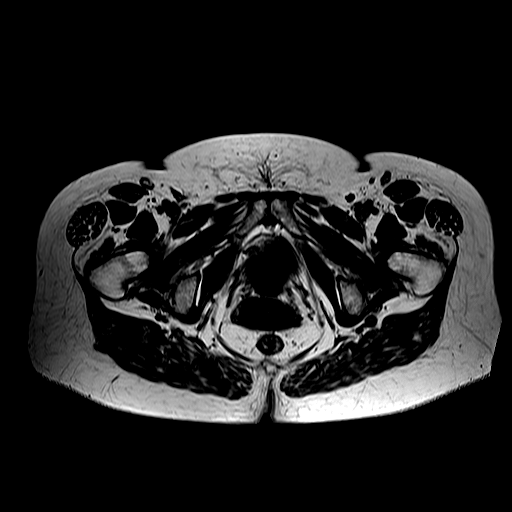
[im 20/30]
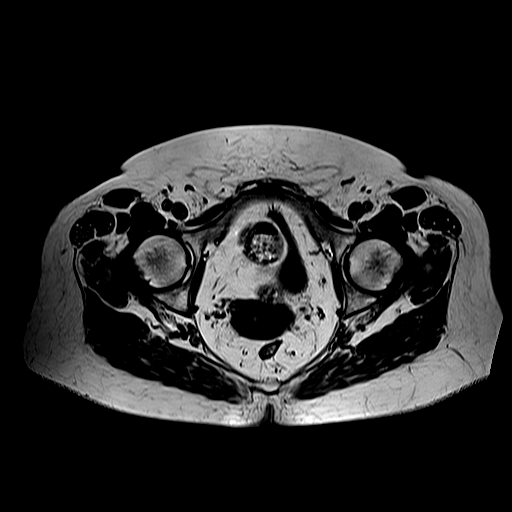
[im 25/30]
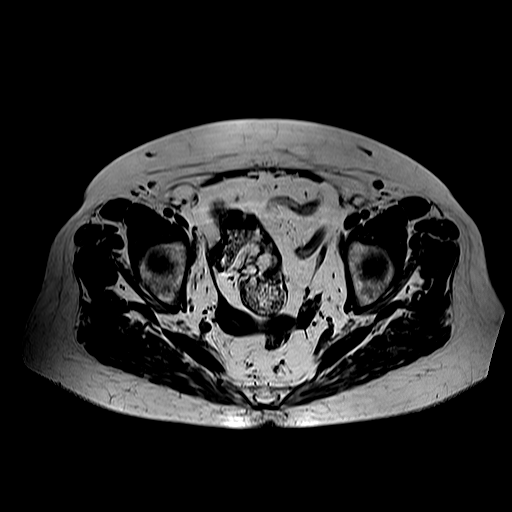
[im 30/30]
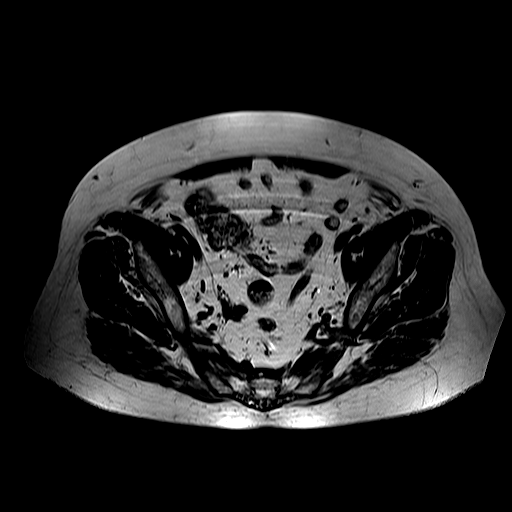

[Series 10: T1 · coronal · left · 4.5mm · 0.82mm/px · 1 of 20 slices shown (2 of 2)]
[im 1/20]
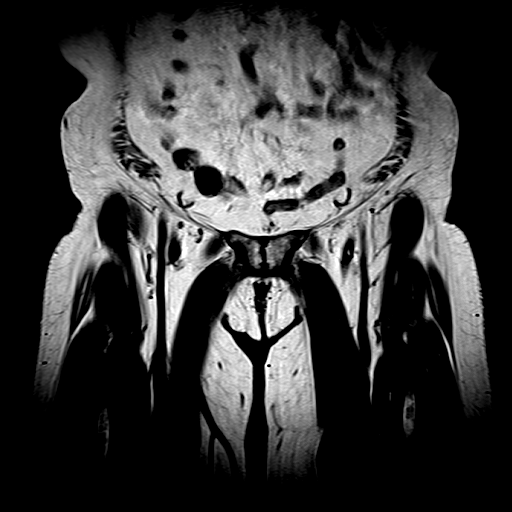

[Series 11: T2 fat-sat · coronal · left · 4.5mm · 0.82mm/px · 5 of 20 slices shown (2 of 3)]
[im 1/20]
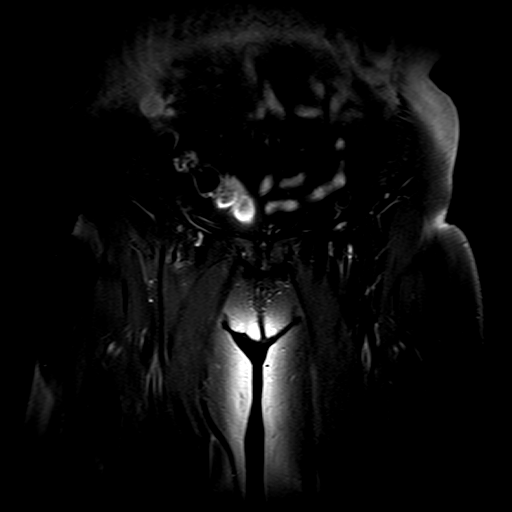
[im 5/20]
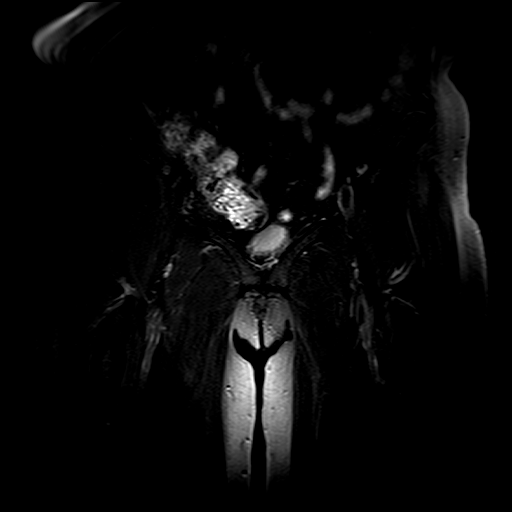
[im 10/20]
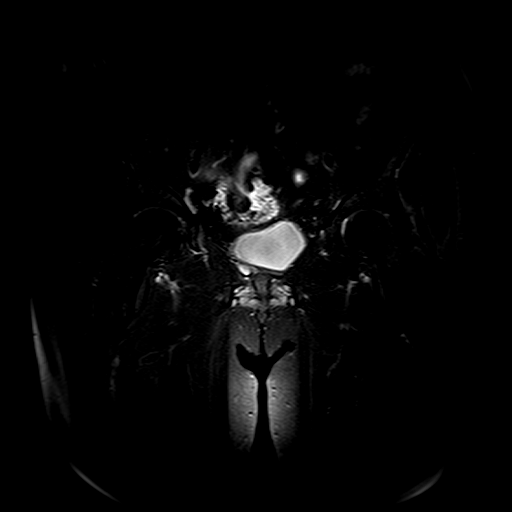
[im 15/20]
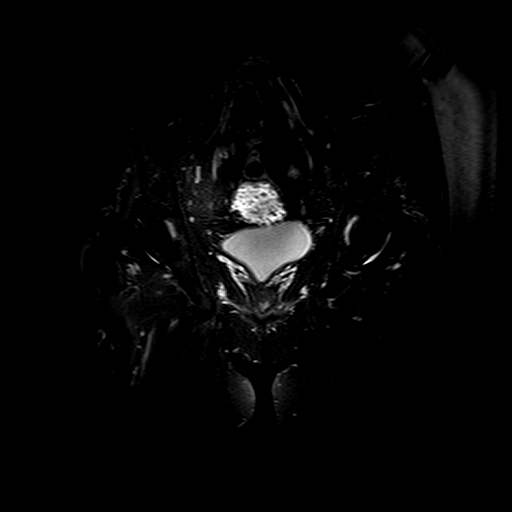
[im 20/20]
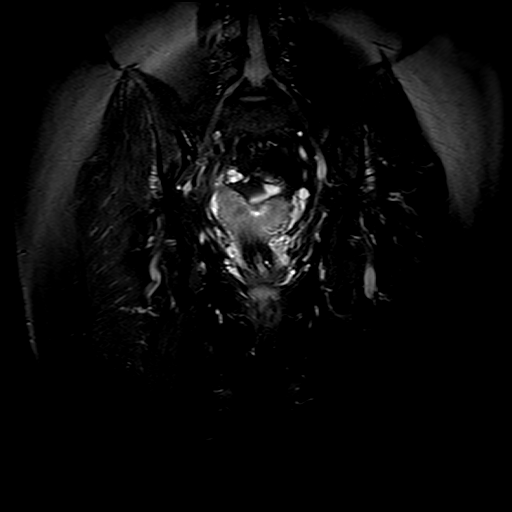

[Series 13: T2 fat-sat · sagittal · left · 4.5mm · 0.78mm/px · 5 of 20 slices shown (3 of 3)]
[im 1/20]
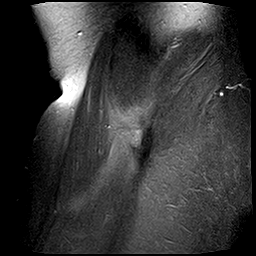
[im 5/20]
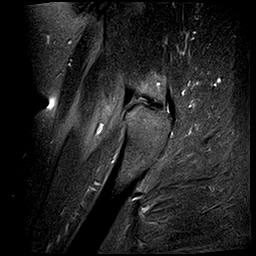
[im 10/20]
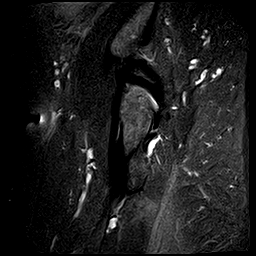
[im 15/20]
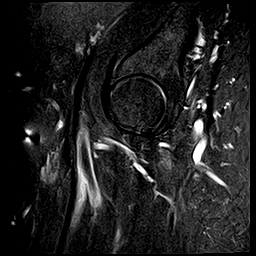
[im 20/20]
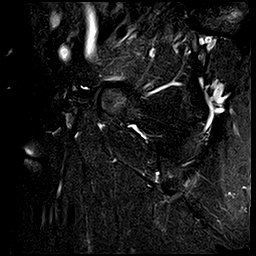

[24 of 40 positions shown; findings below may reference images not displayed]

FINDINGS: No acute fractures or bone bruises are seen at the pelvis including left hip.  No evidence of avascular necrosis of the femoral head at both hips.

Mild osteoarthritis of both hip joints.  No evidence of acetabular labral tear.

Soft tissues are normal around the pelvis and left hip joint.  Incidental finding of bicornuate uterus within the pelvis.
IMPRESSION: 1. No evidence of acute fracture or bone bruise is seen.  No evidence of avascular necrosis of the femoral head at the left hip.

2. Mild osteoarthritis of both hip joints.

3. No acute abnormalities of the soft tissues.  Bicornuate uterus.

## 2022-02-04 ENCOUNTER — Encounter (INDEPENDENT_AMBULATORY_CARE_PROVIDER_SITE_OTHER): Payer: Self-pay | Admitting: NURSE PRACTITIONER

## 2022-02-04 ENCOUNTER — Other Ambulatory Visit: Payer: Self-pay

## 2022-02-04 ENCOUNTER — Ambulatory Visit (INDEPENDENT_AMBULATORY_CARE_PROVIDER_SITE_OTHER): Payer: Medicare Other | Admitting: NURSE PRACTITIONER

## 2022-02-04 VITALS — BP 137/76 | HR 82 | Ht 68.0 in | Wt 203.1 lb

## 2022-02-04 DIAGNOSIS — I428 Other cardiomyopathies: Secondary | ICD-10-CM

## 2022-02-04 DIAGNOSIS — I10 Essential (primary) hypertension: Secondary | ICD-10-CM

## 2022-02-04 DIAGNOSIS — I447 Left bundle-branch block, unspecified: Secondary | ICD-10-CM

## 2022-02-04 DIAGNOSIS — E785 Hyperlipidemia, unspecified: Secondary | ICD-10-CM

## 2022-02-04 MED ORDER — FUROSEMIDE 20 MG TABLET
20.0000 mg | ORAL_TABLET | Freq: Every day | ORAL | 0 refills | Status: AC
Start: 2022-02-04 — End: 2022-05-05

## 2022-02-04 MED ORDER — LISINOPRIL 20 MG TABLET
20.0000 mg | ORAL_TABLET | Freq: Every day | ORAL | 3 refills | Status: AC
Start: 2022-02-04 — End: 2023-01-30

## 2022-02-04 MED ORDER — CARVEDILOL 12.5 MG TABLET
12.5000 mg | ORAL_TABLET | Freq: Two times a day (BID) | ORAL | 3 refills | Status: AC
Start: 2022-02-04 — End: 2023-01-30

## 2022-02-04 NOTE — Progress Notes (Signed)
Cardiology Clinic [facility] Cardiology    Name: Laura Leblanc  Age: 66 y.o.  Date of Service: 02/04/2022    Primary Care Provider: Mariah Milling, DO  Chief Complaint:   Chief Complaint   Patient presents with    Follow Up 6 Months    Hypertension    Hyperlipidemia       Subjective:   Laura Leblanc is a very pleasant 66 y.o. female with a past medical history significant for non-ischemic cardiomyopathy diagnosed in 1997. The patient had a cardiac catheterization in [place] and about 11 years ago she had another cardiac catheterization on [place]. 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 Ejection fraction has been normalized on studies dated 2017 and 2018 with ejection fraction 50% to 60%. Nuclear stress test performed in July 2017 with Lexiscan stress test was normal with EF of 60 %. Echo in July 2019 showed aortic valve sclerosis and no stenosis. Anterior wall and septum were severely hypokinetic. LV function was 40% to 45%. Repeat echocardiogram in May 2020.  EF 45 to 50%. echo on May 25th, 2021 which showed mildly reduced left ventricular systolic function with an ejection fraction estimated at 45-50%.  There appeared to be hypokinesis of the basal to mid anteroseptal wall and some mild degree of dyssynergy, otherwise but this appears to be unchanged from study dated 09/12/2018, unchanged or mildly improved from study dated 09/12/2018. Mild left atrial enlargement. No significant valvular disease. Primary care plain treadmill stress test and echocardiogram in August 2022, plain treadmill stress test was inconclusive as she has underlying LBBB and only exercised for 5 minutes due to foot pain.  Echocardiogram showed reduced EF of 45% with mild basal to mid lateral hypokinesis.  Unchanged from previous.       08/06/21 The patient is here for routine follow-up for nonischemic cardiomyopathy.  She reports she is doing well.  She denies any significant  chest pains or shortness of breath.  She did have a fall recently, she was standing outside on a bucket trying to change a bird feeder and fell.  She did bruise upper left side pretty bad, but she is doing well.  She is tolerating medications without difficulty.  She states her blood pressures been well controlled at home.  She does have some arthritis issues and is wanting to know if I can center in some ibuprofen.  Otherwise, no complaints today.    02/04/2022:  The patient is here for routine follow up for nonischemic cardiomyopathy.  She reports she is doing well.  Denies any significant chest pain or palpitations.  She does report she has some chronic shortness a breath but this is unchanged.  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  Also primary care did start her on statin therapy for worsening LDL of 1 35.  Blood pressure is controlled.  No new concerns voiced today.      Past Medical History:  Past Medical History:   Diagnosis Date    Arthritis     DVT (deep venous thrombosis) (CMS HCC)     Dyslipidemia     Enlarged heart     Essential hypertension     Left bundle branch block     Non-ischemic cardiomyopathy (CMS HCC)          Social History:  Social History     Tobacco Use   Smoking Status Never  Smokeless Tobacco Never      Social History     Substance and Sexual Activity   Alcohol Use Never      Social History     Substance and Sexual Activity   Drug Use Never      Current Medications:  Current Outpatient Medications   Medication Sig    allopurinoL (ZYLOPRIM) 100 mg Oral Tablet Take 1 Tablet (100 mg total) by mouth Once a day    aspirin (ECOTRIN) 81 mg Oral Tablet, Delayed Release (E.C.) Take 1 Tablet (81 mg total) by mouth Per instructions 3 times a week (Patient not taking: Reported on 08/06/2021)    carvediloL (COREG) 12.5 mg Oral Tablet Take 1 Tablet (12.5 mg total) by mouth Twice daily with food for 90 days    cyclobenzaprine (FLEXERIL) 10 mg Oral Tablet Take 1 Tablet  (10 mg total) by mouth Three times a day as needed for Muscle spasms (Patient not taking: Reported on 08/06/2021)    docusate sodium (COLACE) 100 mg Oral Capsule Take 1 Capsule (100 mg total) by mouth Once a day    doxycycline 100 mg Oral Tablet Take 1 Tablet (100 mg total) by mouth Twice daily (Patient not taking: Reported on 08/06/2021)    famotidine (PEPCID) 20 mg Oral Tablet Take 1 Tablet (20 mg total) by mouth Twice daily (Patient not taking: Reported on 08/06/2021)    furosemide (LASIX) 20 mg Oral Tablet Take 1 Tablet (20 mg total) by mouth Once a day    Ibuprofen (MOTRIN) 800 mg Oral Tablet Take 1 Tablet (800 mg total) by mouth Three times a day as needed for Pain    lisinopriL (PRINIVIL) 20 mg Oral Tablet Take 1 Tablet (20 mg total) by mouth Once a day for 90 days    rosuvastatin (CRESTOR) 20 mg Oral Tablet Take 1 Tablet (20 mg total) by mouth Once a day     Allergies:  Allergies   Allergen Reactions    Morphine Anaphylaxis      Review of Systems:  Complete ROS was performed and otherwise negative unless noted in HPI.      Vital Signs:  Vitals:    02/04/22 1519   BP: 137/76   Pulse: 82   SpO2: 95%   Weight: 92.1 kg (203 lb 2 oz)   Height: 1.727 m (5\' 8" )   BMI: 30.95      Physical Exam:  General: Pt resting comfortably in no acute distress and appears stated age.    Neck: No JVD, no carotid bruit. Neck supple, symmetrical, trachea midline.   Lungs:  Normal respiratory effort, lungs clear to auscultation bilaterally.    Cardiovascular: Regular rate and rhythm without murmurs rubs or gallops and vascular pulses are 2+ throughout.  Abdomen: Soft, non-tender  Extremities: Extremities normal, atraumatic, no cyanosis or edema.    Neurologic: Alert and oriented x3.       Assessment: Nonischemic cardiomyopathy (CMS HCC)    Essential hypertension    Dyslipidemia    LBBB (left bundle branch block)     Plan:  Patient is stable we will continue current medications as prescribed.  We will repeat echocardiogram prior to next  visit in 6 months.  Encouraged her to continue with statin therapy.  Advised low-fat, low-cholesterol, heart healthy diet.       Orders placed this visit:  Orders Placed This Encounter    EKG (In-Clinic Today)       JONEISHA MILES is to return to clinic  for follow up with the understanding that should symptoms change or worsen she is to call the office or go to the closest emergency department for evaluation.    Marie Borowski, FNP-C    A portion of this documentation may have been generated using MMODAL voice recognition software and may contain syntax/voice recognition errors.

## 2022-02-24 LAB — ECG W INTERP (AMB USE ONLY)(MUSE,IN CLINIC)
Atrial Rate: 77 {beats}/min
Calculated P Axis: 70 degrees
Calculated R Axis: -9 degrees
Calculated T Axis: 105 degrees
PR Interval: 174 ms
QRS Duration: 144 ms
QT Interval: 424 ms
QTC Calculation: 479 ms
Ventricular rate: 77 {beats}/min

## 2022-06-10 ENCOUNTER — Encounter (INDEPENDENT_AMBULATORY_CARE_PROVIDER_SITE_OTHER): Payer: Self-pay | Admitting: Student in an Organized Health Care Education/Training Program

## 2022-06-10 ENCOUNTER — Other Ambulatory Visit: Payer: Self-pay

## 2022-06-10 ENCOUNTER — Ambulatory Visit (INDEPENDENT_AMBULATORY_CARE_PROVIDER_SITE_OTHER): Payer: Medicare Other | Admitting: Student in an Organized Health Care Education/Training Program

## 2022-06-10 VITALS — BP 137/73 | HR 76 | Ht 68.0 in | Wt 206.0 lb

## 2022-06-10 DIAGNOSIS — N3281 Overactive bladder: Secondary | ICD-10-CM

## 2022-06-10 DIAGNOSIS — N3946 Mixed incontinence: Secondary | ICD-10-CM

## 2022-06-10 DIAGNOSIS — N289 Disorder of kidney and ureter, unspecified: Secondary | ICD-10-CM

## 2022-06-10 DIAGNOSIS — N3941 Urge incontinence: Secondary | ICD-10-CM

## 2022-06-10 DIAGNOSIS — N393 Stress incontinence (female) (male): Secondary | ICD-10-CM

## 2022-06-10 NOTE — Progress Notes (Signed)
[place], [facility]    Progress Note    Name: Laura Leblanc MRN:  [id number]   Date: 06/10/2022 Age: 67 y.o.       Chief Complaint: New Patient (Renal mass)    Subjective:   Laura Leblanc is a pleasant 67 year old female who underwent a renal ultrasound in July 2022 for an elevated ferritin level which demonstrated a possible 2 cm mass on the left kidney.  Three days later in 2022 she underwent a CT renal mass protocol at [facility] which was negative for any masses or abnormalities.  She presents today for follow-up she was told that she needs follow-up for this.  She was a lifetime nonsmoker.  She denies a personal or family history of urologic malignancy.  She denies a personal history of malignancy. She denies fevers, chills, nausea, vomiting, hematuria, dysuria, flank pain, incontinence, dribbling, hesitancy, suprapubic pain, headaches, vision changes, shortness of breath, chest pain.        Objective :  BP 137/73 (Site: Right, Patient Position: Sitting, Cuff Size: Adult)   Pulse 76   Ht 1.727 m ('5\' 8"'$ )   Wt 93.4 kg (206 lb)   BMI 31.32 kg/m       Gen: NAD, alert  Pulm: unlabored at rest  CV: palpable pulses  Abd: soft, Nt/ND  GU: no suprapubic tenderness, no CVAT    Data reviewed:    Current Outpatient Medications   Medication Sig    allopurinoL (ZYLOPRIM) 100 mg Oral Tablet Take 1 Tablet (100 mg total) by mouth Once a day    carvediloL (COREG) 12.5 mg Oral Tablet Take 1 Tablet (12.5 mg total) by mouth Twice daily with food for 360 days    docusate sodium (COLACE) 100 mg Oral Capsule Take 1 Capsule (100 mg total) by mouth Once a day    furosemide (LASIX) 20 mg Oral Tablet Take 1 Tablet (20 mg total) by mouth Once a day    Ibuprofen (MOTRIN) 800 mg Oral Tablet Take 1 Tablet (800 mg total) by mouth Three times a day as needed for Pain    lisinopriL (PRINIVIL) 20 mg Oral Tablet Take 1 Tablet (20 mg total) by mouth Once a day for 360 days    rosuvastatin (CRESTOR) 20 mg  Oral Tablet Take 1 Tablet (20 mg total) by mouth Once a day     Assessment/Plan  Problem List Items Addressed This Visit    None    Abnormal renal US with possible 2cm left renal mass on 11/11/2020; negative dedicated CT renal mass protocol on 11/14/2020  Pathophysiology, workup and treatment of a abnormal lesion was discussed with the patient and her husband full detail.  All questions were answered.  I discussed the limitations of a renal ultrasound as it relates to evaluation of the mass and discussed the appropriate imaging modality is a CT renal mass protocol which she had.  I reviewed the impression of the CT with her which demonstrated no masses.  I informed her that without risk factors and with a negative CT renal mass protocol there was no need for surveillance imaging.  The patient and her husband verbalized understanding of this      Mixed urinary incontinence mild  I discussed the differential diagnosis, pathophysiology and nature of patient's overactive bladder/urge urinary incontinence  I also counseled patient on conservative management options including appropriate fluid management, avoidance of diuretics including caffeine and alcohol, weight loss (if applicable), dedicated pelvic floor  muscle therapy and Kegel's exercises  Additionally, we discussed the role of pharmacotherapy, including risks, benefits and alternatives:  Anticholinergic therapy (e.g. Oxybutynin, Tolterodine, Solifenacin, etc)- discussed potential risks of dry mouth, dry eyes, constipation, impaired cognition, prolonged Q-T interval and urinary retention  Beta-3 agonist therapy (e.g. Mirabegron) - discussed potential risks of hypertension, nasopharyngitis, urinary tract infection and headache  We discussed in detail the nature and pathophysiology of mildly bothersome stress urinary incontinence   Given the bothersome nature of her symptoms, we discussed potential treatment options including:  Conservative management, including  pelvic floor "Kegel" exercises, which is generally reserved for mild cases  Estrogen replacement therapy either primarily or as an adjunct, when not contraindicated, to restore blood flow to the female pelvic organs to aid in preserving vascularity to her pelvic floor  Urethral bulking agents  Synthetic midurethral sling delivered either a retropubic or transobturator approach  Bladder neck sling utilizing autologous fascia  We also discussed the incidence, nature and risks of de novo urge urinary incontinence following treatment of stress urinary incontinence, particularly following midurethral sling    Additionally, we discussed the [id number] FDA Public Health Notification on "Serious Complications Associated with Transvaginal Placement of Surgical Mesh in Repair of ... Stress Urinary Incontinence" including complete failure, de novo pelvic organ prolapse, foreign body reaction, fistula formation, infection, graft erosion/extrusion/migration, nerve damage, pain, urinary tract obstruction, urinary retention, vaginal contracture, wound dehiscence and sexual dysfunction.        Landis Gandy, DO       A combined total of 45 minutes were spent preparing to see the patient, reviewing previous records, ordering tests/medications/procedures, documenting the clinical encounter as well as performing a medically appropriate evaluation and independently interpreting results and communicating them to the patient/family/caregiver as specifically outlined above in the impression and plan.    This note may have been fully or partially generated using MModal Fluency Direct system, and there may be some incorrect words, spellings, and punctuation that were not identified in checking the note before saving.

## 2022-07-12 ENCOUNTER — Other Ambulatory Visit (INDEPENDENT_AMBULATORY_CARE_PROVIDER_SITE_OTHER): Payer: Self-pay | Admitting: NURSE PRACTITIONER

## 2022-07-12 DIAGNOSIS — I428 Other cardiomyopathies: Secondary | ICD-10-CM

## 2022-07-12 DIAGNOSIS — I10 Essential (primary) hypertension: Secondary | ICD-10-CM

## 2022-07-13 ENCOUNTER — Other Ambulatory Visit (INDEPENDENT_AMBULATORY_CARE_PROVIDER_SITE_OTHER): Payer: Self-pay | Admitting: INTERVENTIONAL CARDIOLOGY

## 2022-07-13 ENCOUNTER — Telehealth (INDEPENDENT_AMBULATORY_CARE_PROVIDER_SITE_OTHER): Payer: Self-pay | Admitting: INTERVENTIONAL CARDIOLOGY

## 2022-07-13 DIAGNOSIS — I10 Essential (primary) hypertension: Secondary | ICD-10-CM

## 2022-07-13 NOTE — Telephone Encounter (Signed)
Pt notified of scheduled ECHO on 08/04/22 at 1000at [facility]  Pt states understanding.

## 2022-08-04 ENCOUNTER — Other Ambulatory Visit: Payer: Self-pay

## 2022-08-04 ENCOUNTER — Inpatient Hospital Stay
Admission: RE | Admit: 2022-08-04 | Discharge: 2022-08-04 | Disposition: A | Discharge status: Home / Self Care | Payer: Medicare Other | Source: Ambulatory Visit | Attending: INTERVENTIONAL CARDIOLOGY | Admitting: INTERVENTIONAL CARDIOLOGY

## 2022-08-04 DIAGNOSIS — I10 Essential (primary) hypertension: Secondary | ICD-10-CM | POA: Insufficient documentation

## 2022-08-05 ENCOUNTER — Ambulatory Visit (INDEPENDENT_AMBULATORY_CARE_PROVIDER_SITE_OTHER): Payer: Medicare Other | Admitting: NURSE PRACTITIONER

## 2022-08-05 ENCOUNTER — Encounter (INDEPENDENT_AMBULATORY_CARE_PROVIDER_SITE_OTHER): Payer: Self-pay | Admitting: NURSE PRACTITIONER

## 2022-08-05 VITALS — BP 150/80 | HR 72 | Ht 68.0 in | Wt 209.0 lb

## 2022-08-05 DIAGNOSIS — E785 Hyperlipidemia, unspecified: Secondary | ICD-10-CM

## 2022-08-05 DIAGNOSIS — I428 Other cardiomyopathies: Secondary | ICD-10-CM

## 2022-08-05 DIAGNOSIS — I10 Essential (primary) hypertension: Secondary | ICD-10-CM

## 2022-08-05 DIAGNOSIS — R9431 Abnormal electrocardiogram [ECG] [EKG]: Secondary | ICD-10-CM

## 2022-08-05 DIAGNOSIS — I447 Left bundle-branch block, unspecified: Secondary | ICD-10-CM

## 2022-08-05 MED ORDER — FUROSEMIDE 20 MG TABLET
20.0000 mg | ORAL_TABLET | Freq: Every day | ORAL | 3 refills | Status: AC
Start: 2022-08-05 — End: ?

## 2022-08-05 NOTE — Addendum Note (Signed)
Addended by: Cathey Endow on: 08/05/2022 04:02 PM     Modules accepted: Orders

## 2022-08-05 NOTE — Progress Notes (Signed)
Cardiology Clinic [facility] Cardiology    Name: Laura Leblanc  Age: 67 y.o.  Date of Service: 08/05/2022    Primary Care Provider: Mariah Milling, DO  Chief Complaint:   Chief Complaint   Patient presents with    Hypertension    Hyperlipidemia    Follow Up 6 Months     Nonischemic cardiomyopathy  L BBB  ECHO 08/04/2022       Subjective:  The patient has a history of non-ischemic cardiomyopathy diagnosed in 1997. The patient had a cardiac catheterization in [place] and about 11 years ago she had another cardiac catheterization on [place]. 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 Ejection fraction has been normalized on studies dated 2017 and 2018 with ejection fraction 50% to 60%. Nuclear stress test performed in July 2017 with Lexiscan stress test was normal with EF of 60 %. Echo in July 2019 showed aortic valve sclerosis and no stenosis. Anterior wall and septum were severely hypokinetic. LV function was 40% to 45%. Repeat echocardiogram in May 2020.  EF 45 to 50%. echo on May 25th, 2021 which showed mildly reduced left ventricular systolic function with an ejection fraction estimated at 45-50%.  There appeared to be hypokinesis of the basal to mid anteroseptal wall and some mild degree of dyssynergy, otherwise but this appears to be unchanged from study dated 09/12/2018, unchanged or mildly improved from study dated 09/12/2018. Mild left atrial enlargement. No significant valvular disease. Primary care plain treadmill stress test and echocardiogram in August 2022, plain treadmill stress test was inconclusive as she has underlying LBBB and only exercised for 5 minutes due to foot pain.  Echocardiogram showed reduced EF of 45% with mild basal to mid lateral hypokinesis.  Unchanged from previous.       08/06/21 The patient is here for routine follow-up for nonischemic cardiomyopathy.  She reports she is doing well.  She denies any significant chest pains  or shortness of breath.  She did have a fall recently, she was standing outside on a bucket trying to change a bird feeder and fell.  She did bruise upper left side pretty bad, but she is doing well.  She is tolerating medications without difficulty.  She states her blood pressures been well controlled at home.  She does have some arthritis issues and is wanting to know if I can center in some ibuprofen.  Otherwise, no complaints today.    02/04/2022:  The patient is here for routine follow up for nonischemic cardiomyopathy.  She reports she is doing well.  Denies any significant chest pain or palpitations.  She does report she has some chronic shortness a breath but this is unchanged.  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  Also primary care did start her on statin therapy for worsening LDL of 1 35.  Blood pressure is controlled.  No new concerns voiced today.      08/05/22 The patient is here for routine follow-up.  Echocardiogram in March showed preserved EF of 50% with no significant valvular disease.  She denies any chest pains.  She does have some shortness of breath but this is unchanged from baseline.  Blood pressure is elevated today, but she received a steroid injection in her knee and it has been elevated since then.  Prior to then she had no issues.  She takes her blood pressure Monday and it was 118 over  62.  No other complaints.      Past Medical History:  Past Medical History:   Diagnosis Date    Arthritis     DVT (deep venous thrombosis) (CMS HCC)     Dyslipidemia     Enlarged heart     Essential hypertension     Left bundle branch block     Non-ischemic cardiomyopathy (CMS HCC)          Social History:  Social History     Tobacco Use   Smoking Status Never   Smokeless Tobacco Never      Social History     Substance and Sexual Activity   Alcohol Use Never      Social History     Substance and Sexual Activity   Drug Use Never      Current Medications:  Current  Outpatient Medications   Medication Sig    allopurinoL (ZYLOPRIM) 100 mg Oral Tablet Take 1 Tablet (100 mg total) by mouth Once a day    carvediloL (COREG) 12.5 mg Oral Tablet Take 1 Tablet (12.5 mg total) by mouth Twice daily with food for 360 days    furosemide (LASIX) 20 mg Oral Tablet Take 1 Tablet (20 mg total) by mouth Once a day    Ibuprofen (MOTRIN) 800 mg Oral Tablet Take 1 Tablet (800 mg total) by mouth Three times a day as needed for Pain    lisinopriL (PRINIVIL) 20 mg Oral Tablet Take 1 Tablet (20 mg total) by mouth Once a day for 360 days    rosuvastatin (CRESTOR) 20 mg Oral Tablet Take 1 Tablet (20 mg total) by mouth Once a day    sennosides/docusate sodium (SENNA PLUS ORAL) Take 1 Tablet by mouth Every night     Allergies:  Allergies   Allergen Reactions    Morphine Anaphylaxis      Review of Systems:  Complete ROS was performed and otherwise negative unless noted in HPI.    Vital Signs:  Vitals:    08/05/22 1521   BP: (!) 150/80   Pulse: 72   SpO2: 96%   Weight: 94.8 kg (209 lb)   Height: 1.727 m ( )   BMI: 31.84      Physical Exam:  General: Pt resting comfortably in no acute distress and appears stated age.    Neck: No JVD, no carotid bruit. Neck supple, symmetrical, trachea midline.   Lungs:  Normal respiratory effort, lungs clear to auscultation bilaterally.    Cardiovascular:  Regular rate and rhythm.  Normal S1 and S2 without murmur, gallop, or rub.  Abdomen: Soft, non-tender and bowel sounds normal.    Extremities: Extremities normal, atraumatic, no cyanosis or edema.    Neurologic: Alert and oriented x3.     Assessment:    Nonischemic cardiomyopathy (CMS HCC)    LBBB (left bundle branch block)    Essential hypertension, benign    Dyslipidemia      Plan:   Echocardiogram stable.  Continue current medications.  Continue monitor blood pressure and notify if staying elevated.  She is scheduled to have lab work within the next couple months with PCP, we will follow-up on LDL at that time, she  has now been on Crestor for a year.  Hopefully numbers are improved.  Return in 6 months for routine follow-up.    Orders placed this visit:  Orders Placed This Encounter    EKG (In-Clinic Today)       Laura Leblanc is  to return to clinic for follow up with the understanding that should symptoms change or worsen she is to call the office or go to the closest emergency department for evaluation.    Isabell Jarvis, APRN,FNP-BC    A portion of this documentation may have been generated using MMODAL voice recognition software and may contain syntax/voice recognition errors.

## 2022-09-16 LAB — ECG W INTERP (AMB USE ONLY)(MUSE,IN CLINIC)
Atrial Rate: 66 {beats}/min
Calculated P Axis: 74 degrees
Calculated R Axis: -28 degrees
Calculated T Axis: 85 degrees
PR Interval: 152 ms
QRS Duration: 142 ms
QT Interval: 444 ms
QTC Calculation: 465 ms
Ventricular rate: 66 {beats}/min

## 2023-02-14 ENCOUNTER — Encounter (INDEPENDENT_AMBULATORY_CARE_PROVIDER_SITE_OTHER): Payer: Self-pay

## 2023-02-14 ENCOUNTER — Ambulatory Visit (INDEPENDENT_AMBULATORY_CARE_PROVIDER_SITE_OTHER): Payer: Self-pay | Admitting: NURSE PRACTITIONER

## 2023-08-01 ENCOUNTER — Encounter (INDEPENDENT_AMBULATORY_CARE_PROVIDER_SITE_OTHER): Payer: Self-pay

## 2023-10-03 ENCOUNTER — Ambulatory Visit: Attending: Surgery | Admitting: Surgery

## 2023-10-03 DIAGNOSIS — D229 Melanocytic nevi, unspecified: Secondary | ICD-10-CM | POA: Insufficient documentation

## 2023-10-05 DIAGNOSIS — D225 Melanocytic nevi of trunk: Secondary | ICD-10-CM

## 2023-10-05 DIAGNOSIS — D2271 Melanocytic nevi of right lower limb, including hip: Secondary | ICD-10-CM

## 2023-10-05 DIAGNOSIS — D2261 Melanocytic nevi of right upper limb, including shoulder: Secondary | ICD-10-CM

## 2023-10-05 DIAGNOSIS — L821 Other seborrheic keratosis: Secondary | ICD-10-CM

## 2023-10-05 LAB — SURGICAL PATHOLOGY SPECIMEN
